# Patient Record
Sex: Male | Born: 1995 | Race: Black or African American | Hispanic: No | Marital: Single | Smoking: Never smoker
Health system: Southern US, Community
[De-identification: ages and names within clinical notes are randomized; demographics above are authoritative.]

## PROBLEM LIST (undated history)

## (undated) DIAGNOSIS — F488 Other specified nonpsychotic mental disorders: Secondary | ICD-10-CM

## (undated) DIAGNOSIS — R42 Dizziness and giddiness: Secondary | ICD-10-CM

## (undated) DIAGNOSIS — R4189 Other symptoms and signs involving cognitive functions and awareness: Secondary | ICD-10-CM

## (undated) DIAGNOSIS — K5900 Constipation, unspecified: Secondary | ICD-10-CM

## (undated) DIAGNOSIS — F419 Anxiety disorder, unspecified: Secondary | ICD-10-CM

## (undated) DIAGNOSIS — M542 Cervicalgia: Secondary | ICD-10-CM

## (undated) HISTORY — DX: Dizziness and giddiness: R42

## (undated) HISTORY — DX: Other symptoms and signs involving cognitive functions and awareness: R41.89

## (undated) HISTORY — DX: Anxiety disorder, unspecified: F41.9

## (undated) HISTORY — PX: WISDOM TOOTH EXTRACTION: SHX21

## (undated) HISTORY — DX: Cervicalgia: M54.2

---
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
## 2019-10-13 ENCOUNTER — Encounter (HOSPITAL_COMMUNITY): Payer: Self-pay | Admitting: Emergency Medicine

## 2019-10-13 ENCOUNTER — Emergency Department (HOSPITAL_COMMUNITY): Payer: Self-pay

## 2019-10-13 ENCOUNTER — Other Ambulatory Visit: Payer: Self-pay

## 2019-10-13 ENCOUNTER — Emergency Department (HOSPITAL_COMMUNITY)
Admission: EM | Admit: 2019-10-13 | Discharge: 2019-10-13 | Disposition: A | Discharge status: Home / Self Care | Payer: Self-pay | Attending: Emergency Medicine | Admitting: Emergency Medicine

## 2019-10-13 DIAGNOSIS — F419 Anxiety disorder, unspecified: Secondary | ICD-10-CM | POA: Insufficient documentation

## 2019-10-13 DIAGNOSIS — R42 Dizziness and giddiness: Secondary | ICD-10-CM | POA: Insufficient documentation

## 2019-10-13 DIAGNOSIS — H539 Unspecified visual disturbance: Secondary | ICD-10-CM | POA: Insufficient documentation

## 2019-10-13 LAB — CBC WITH DIFFERENTIAL/PLATELET
Abs Immature Granulocytes: 0.01 10*3/uL (ref 0.00–0.07)
Basophils Absolute: 0 10*3/uL (ref 0.0–0.1)
Basophils Relative: 0 %
Eosinophils Absolute: 0.1 10*3/uL (ref 0.0–0.5)
Eosinophils Relative: 1 %
HCT: 49.3 % (ref 39.0–52.0)
Hemoglobin: 15.7 g/dL (ref 13.0–17.0)
Immature Granulocytes: 0 %
Lymphocytes Relative: 22 %
Lymphs Abs: 1.2 10*3/uL (ref 0.7–4.0)
MCH: 28.6 pg (ref 26.0–34.0)
MCHC: 31.8 g/dL (ref 30.0–36.0)
MCV: 90 fL (ref 80.0–100.0)
Monocytes Absolute: 0.5 10*3/uL (ref 0.1–1.0)
Monocytes Relative: 10 %
Neutro Abs: 3.4 10*3/uL (ref 1.7–7.7)
Neutrophils Relative %: 67 %
Platelets: 241 10*3/uL (ref 150–400)
RBC: 5.48 MIL/uL (ref 4.22–5.81)
RDW: 11.9 % (ref 11.5–15.5)
WBC: 5.2 10*3/uL (ref 4.0–10.5)
nRBC: 0 % (ref 0.0–0.2)

## 2019-10-13 LAB — COMPREHENSIVE METABOLIC PANEL
ALT: 60 U/L — ABNORMAL HIGH (ref 0–44)
AST: 29 U/L (ref 15–41)
Albumin: 4.1 g/dL (ref 3.5–5.0)
Alkaline Phosphatase: 74 U/L (ref 38–126)
Anion gap: 14 (ref 5–15)
BUN: 10 mg/dL (ref 6–20)
CO2: 24 mmol/L (ref 22–32)
Calcium: 9.4 mg/dL (ref 8.9–10.3)
Chloride: 100 mmol/L (ref 98–111)
Creatinine, Ser: 1.08 mg/dL (ref 0.61–1.24)
GFR calc Af Amer: >60 mL/min (ref >60)
GFR calc non Af Amer: >60 mL/min (ref >60)
Glucose, Bld: 108 mg/dL — ABNORMAL HIGH (ref 70–99)
Potassium: 3.8 mmol/L (ref 3.5–5.1)
Sodium: 138 mmol/L (ref 135–145)
Total Bilirubin: 0.8 mg/dL (ref 0.3–1.2)
Total Protein: 7.7 g/dL (ref 6.5–8.1)

## 2019-10-13 MED ORDER — SODIUM CHLORIDE 0.9 % IV BOLUS
1000.0000 mL | Freq: Once | INTRAVENOUS | Status: AC
  Administered 2019-10-13: 1000 mL via INTRAVENOUS

## 2019-10-13 MED ORDER — GADOBUTROL 1 MMOL/ML IV SOLN
10.0000 mL | Freq: Once | INTRAVENOUS | Status: AC | PRN
  Administered 2019-10-13: 10 mL via INTRAVENOUS

## 2019-10-13 NOTE — ED Provider Notes (Signed)
[facility] EMERGENCY DEPARTMENT Provider Note   CSN: [id number] Arrival date & time: 10/13/19  0229     History Chief Complaint  Patient presents with  . Anxiety  . Dizziness    Jared Kelly is a 24 y.o. male.  HPI 24 year old male presents with lightheadedness.  Has multiple vague symptoms including lightheadedness constantly for at least 1 month.  Used to have lightheadedness that was attributed to his eyes but now has corrective lenses.  However he states for months his left eye has been blurry compared to the right despite seeing eye specialist.  No specific double vision but sometimes he will see a haze/after image.  On and off headaches.  No fever, vomiting, or numbness in the extremities.  The other day he was lifting weights and both arms felt weak though that has not continued.  Felt like he had an anxiety attack this morning which is why he presented today.  No current headache. Feels like he has been having memory problems/disorientation. Feels off balance when he walks.   Past Medical History:  Diagnosis Date  . Constipation   . Constipation   . Dizziness     Patient Active Problem List   Diagnosis Date Noted  . Generalized abdominal pain 05/26/2012  . Simple constipation 05/26/2012  . Dizziness     History reviewed. No pertinent surgical history.     Family History  Problem Relation Age of Onset  . Diabetes Other     Social History   Tobacco Use  . Smoking status: Never Smoker  . Smokeless tobacco: Never Used  Substance Use Topics  . Alcohol use: No  . Drug use: No    Home Medications Prior to Admission medications   Medication Sig Start Date End Date Taking? Authorizing Provider  tetrahydrozoline 0.05 % ophthalmic solution Place 1 drop into both eyes as needed (dry eyes).   Yes [provider]  ALPRAZolam (XANAX) 0.25 MG tablet Take 1 tablet (0.25 mg total) by mouth 3 (three) times daily as needed for  anxiety. Patient not taking: Reported on 10/13/2019 10/14/12   Charlynne Pander, MD  LORazepam (ATIVAN) 1 MG tablet Take 1 tablet (1 mg total) by mouth 2 (two) times daily as needed for anxiety. Patient not taking: Reported on 10/13/2019 07/03/15   Marlon Pel, PA-C  naphazoline-pheniramine (NAPHCON-A) 0.025-0.3 % ophthalmic solution Place 1 drop into both eyes every 4 (four) hours as needed. Patient not taking: Reported on 10/13/2019 10/14/12   Charlynne Pander, MD    Allergies    Penicillins  Review of Systems   Review of Systems  Eyes: Positive for visual disturbance.  Respiratory: Negative for shortness of breath.   Cardiovascular: Negative for chest pain.  Gastrointestinal: Negative for vomiting.  Neurological: Positive for light-headedness and headaches. Negative for dizziness and numbness.  All other systems reviewed and are negative.   Physical Exam Updated Vital Signs BP 122/71 (BP Location: Left Arm)   Pulse 99   Temp 98.2 F (36.8 C) (Oral)   Resp 16   Ht 6\' 3"  (1.905 m)   Wt 88 kg   SpO2 99%   BMI 24.25 kg/m   Physical Exam Vitals and nursing note reviewed.  Constitutional:      General: He is not in acute distress.    Appearance: He is well-developed. He is not ill-appearing or diaphoretic.  HENT:     Head: Normocephalic and atraumatic.     Right Ear: External ear normal.  Left Ear: External ear normal.     Nose: Nose normal.  Eyes:     General:        Right eye: No discharge.        Left eye: No discharge.     Extraocular Movements: Extraocular movements intact.     Pupils: Pupils are equal, round, and reactive to light.  Cardiovascular:     Rate and Rhythm: Normal rate and regular rhythm.     Heart sounds: Normal heart sounds.  Pulmonary:     Effort: Pulmonary effort is normal.     Breath sounds: Normal breath sounds.  Abdominal:     Palpations: Abdomen is soft.     Tenderness: There is no abdominal tenderness.  Musculoskeletal:     Cervical  back: Neck supple. No rigidity.  Skin:    General: Skin is warm and dry.  Neurological:     Mental Status: He is alert.     Comments: CN 3-12 grossly intact. 5/5 strength in all 4 extremities. Grossly normal sensation. Normal finger to nose.   Psychiatric:        Mood and Affect: Mood is not anxious.     ED Results / Procedures / Treatments   Labs (all labs ordered are listed, but only abnormal results are displayed) Labs Reviewed  COMPREHENSIVE METABOLIC PANEL - Abnormal; Notable for the following components:      Result Value   Glucose, Bld 108 (*)    ALT 60 (*)    All other components within normal limits  CBC WITH DIFFERENTIAL/PLATELET    EKG EKG Interpretation  Date/Time:  Thursday Oct 13 2019 08:35:46 EDT Ventricular Rate:  122 PR Interval:    QRS Duration: 91 QT Interval:  319 QTC Calculation: 455 R Axis:   89 Text Interpretation: Sinus tachycardia Consider right atrial enlargement LVH by voltage No old tracing to compare Confirmed by Sherwood Gambler [phone]) on 10/13/2019 8:52:35 AM   Radiology MR Brain W and Wo Contrast  Result Date: 10/13/2019 CLINICAL DATA:  Ataxia, stroke suspected; multiple sclerosis, new event. Additional provided: Patient reports dizziness and increased anxiety for the past month, reports sensitivity to light at times with headaches. EXAM: MRI HEAD WITHOUT AND WITH CONTRAST TECHNIQUE: Multiplanar, multiecho pulse sequences of the brain and surrounding structures were obtained without and with intravenous contrast. CONTRAST:  53mL GADAVIST GADOBUTROL 1 MMOL/ML IV SOLN COMPARISON:  Non-contrast head CT 04/24/2012 FINDINGS: Brain: No focal parenchymal signal abnormality or abnormal intracranial enhancement is demonstrated. There is no acute infarct. No evidence of intracranial mass. No chronic intracranial blood products. No extra-axial fluid collection. No midline shift. Vascular: Expected proximal arterial flow voids. Expected enhance within the  proximal large arterial vessels and dural venous sinuses. Skull and upper cervical spine: No focal marrow lesion. Sinuses/Orbits: Visualized orbits show no acute finding. Mild ethmoid sinus mucosal thickening. Small right maxillary sinus mucous retention cyst. No significant mastoid effusion. IMPRESSION: 1. Unremarkable MRI appearance of the brain. No evidence of acute intracranial abnormality. 2. Minimal ethmoid sinus mucosal thickening. 3. Small right maxillary sinus mucous retention cyst. Electronically Signed   By: Kellie Simmering DO   On: 10/13/2019 10:15    Procedures Procedures (including critical care time)  Medications Ordered in ED Medications  sodium chloride 0.9 % bolus 1,000 mL (1,000 mLs Intravenous New Bag/Given 10/13/19 0840)  gadobutrol (GADAVIST) 1 MMOL/ML injection 10 mL (10 mLs Intravenous Contrast Given 10/13/19 0951)    ED Course  I have reviewed the triage  vital signs and the nursing notes.  Pertinent labs & imaging results that were available during my care of the patient were reviewed by me and considered in my medical decision making (see chart for details).    MDM Rules/Calculators/A&P                      Patient's lab work has been reviewed and is unremarkable.  ECG also benign.  MRI images reviewed personally as well as report which shows no acute intracranial process.  This would help rule out MS, brain tumor, etc.  My suspicion of acute CNS infection is pretty low.  We will have him follow-up with his PCP. Final Clinical Impression(s) / ED Diagnoses Final diagnoses:  Lightheadedness    Rx / DC Orders ED Discharge Orders    None       Pricilla Loveless, MD 10/13/19 1101

## 2019-10-13 NOTE — ED Triage Notes (Signed)
Pt reports dizziness and increased anxiety for the past month. Reports sensitivity to light at times a/ headaches.  No neuro deficits in triage.

## 2019-10-13 NOTE — ED Notes (Signed)
Taken to CT.

## 2019-10-13 NOTE — ED Notes (Addendum)
Pt said that he will be outside

## 2019-11-03 ENCOUNTER — Ambulatory Visit: Payer: Self-pay | Admitting: Neurology

## 2019-11-03 ENCOUNTER — Encounter: Payer: Self-pay | Admitting: Neurology

## 2019-11-03 ENCOUNTER — Other Ambulatory Visit: Payer: Self-pay

## 2019-11-03 VITALS — BP 149/85 | HR 108 | Ht 75.0 in | Wt 191.0 lb

## 2019-11-03 DIAGNOSIS — R413 Other amnesia: Secondary | ICD-10-CM

## 2019-11-03 DIAGNOSIS — G43709 Chronic migraine without aura, not intractable, without status migrainosus: Secondary | ICD-10-CM

## 2019-11-03 DIAGNOSIS — IMO0002 Reserved for concepts with insufficient information to code with codable children: Secondary | ICD-10-CM | POA: Insufficient documentation

## 2019-11-03 MED ORDER — NORTRIPTYLINE HCL 10 MG PO CAPS: 20.0000 mg | ORAL_CAPSULE | Freq: Every day | ORAL | 11 refills | Status: DC

## 2019-11-03 NOTE — Progress Notes (Addendum)
PATIENT: Jared Kelly DOB: [birth year]  Chief Complaint  Patient presents with  . Neck Pain/Brain Fog    Reports intermittent neck and bilateral shoulder pain which is not his greatest concern. It does not radiate to his arms. He is more worried about being in a brain fog. He further describes this as decreased concentration, vision feels dimmed, fullness in ears, jaw tightness, light/noise sensitivity and sometimes excessive eye blinking. Feels using a computer makes it worse. Feels some of this has been going on since age 63. Says he was seen by neurology in the past but no clear etiology was determined.  . Orthopaedics    Jared Earls, MD - referring MD  . PCP    Jared Prader Raelyn Ensign, MD     HISTORICAL  Jared Kelly is a 24 year old male, seen in request by Dr. Delilah Shan, [place] for evaluation of constellation of complaints, initial evaluation was on Nov 03, 2019.   I have reviewed and summarized the referring note from the referring physician.  He moved to [place] at age 72, lives with his mother and sibling at home.  He graduated from [employer], currently work on Social worker, he did well from March 2020 until March 2021, then he began to experience constellation of the symptoms similar to what he experienced at age 33  He described frequent retro-orbital area pain, could not look at the computer anymore, which usually triggered his dizziness, neck pain, frequent eye blinking, he denies significant memory loss, but also lapses on some detail, such as what the day is, he describes ear fullness sensation, sensitive to light, for a while he stay up late on to 3 AM, had difficulty sleeping, he is trying to work on his circadian rhythm now, he was trying to move out to be independent in April 2021, but because of his recurrent body symptoms, concern of being ill, he is currently still stay at home, anxious about his health, he has headache about twice each month, starting at the  base of his neck, moving forward, settled to become retro-orbital area pressure headache, usually improve after overnight sleep  I reviewed multiple emergency room visit in 2013 when he was age 66, he presented to emergency room multiple times for complaints of dizziness, headaches, similar to his current complaints, had extensive multiple repeat laboratory evaluation at that time, which showed normal CBC, thyroid functional test, ESR, CMP,  He reported he tends to [employer] medical information, health related topic, this has caused increased anxiety on him I personally reviewed MRI of the brain with without contrast on Oct 13, 2019 that was normal Laboratory evaluation showed normal CBC CMP  REVIEW OF SYSTEMS: Full 14 system review of systems performed and notable only for above All other review of systems were negative.  ALLERGIES: Allergies  Allergen Reactions  . Penicillins Itching and Rash    HOME MEDICATIONS: Current Outpatient Medications  Medication Sig Dispense Refill  . Multiple Vitamin (MULTIVITAMIN) tablet Take 1 tablet by mouth daily.     No current facility-administered medications for this visit.    PAST MEDICAL HISTORY: Past Medical History:  Diagnosis Date  . Brain fog   . Neck pain     PAST SURGICAL HISTORY: Past Surgical History:  Procedure Laterality Date  . WISDOM TOOTH EXTRACTION      FAMILY HISTORY: Family History  Problem Relation Age of Onset  . Diabetes Other   . Healthy Mother   . Other Father  unsure of medical history    SOCIAL HISTORY: Social History   Socioeconomic History  . Marital status: Single    Spouse name: Not on file  . Number of children: 0  . Years of education: 40  . Highest education level: High school graduate  Occupational History  . 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 Smoking status: Never Smoker  . Smokeless tobacco: Never Used  Substance and Sexual  Activity  . Alcohol use: No  . Drug use: No  . Sexual activity: Not on file  Other Topics Concern  . Not on file  Social History Narrative   Lives at home with his mother and younger brother.   Right-handed.   No daily use of caffeine.   Social Determinants of Health   Financial Resource Strain:   . Difficulty of Paying Living Expenses:   Food Insecurity:   . Worried About Charity fundraiser in the Last Year:   . Arboriculturist in the Last Year:   Transportation Needs:   . Film/video editor (Medical):   Marland Kitchen Lack of Transportation (Non-Medical):   Physical Activity:   . Days of Exercise per Week:   . Minutes of Exercise per Session:   Stress:   . Feeling of Stress :   Social Connections:   . Frequency of Communication with Friends and Family:   . Frequency of Social Gatherings with Friends and Family:   . Attends Religious Services:   . Active Member of Clubs or Organizations:   . Attends Archivist Meetings:   Marland Kitchen Marital Status:   Intimate Partner Violence:   . Fear of Current or Ex-Partner:   . Emotionally Abused:   Marland Kitchen Physically Abused:   . Sexually Abused:      PHYSICAL EXAM   Vitals:   11/03/19 1055  BP: (!) 149/85  Pulse: (!) 108  Weight: 191 lb (86.6 kg)  Height: 6' 3"  (1.905 m)    Not recorded      Body mass index is 23.87 kg/m.  PHYSICAL EXAMNIATION:  Gen: NAD, conversant, well nourised, well groomed                     Cardiovascular: Regular rate rhythm, no peripheral edema, warm, nontender. Eyes: Conjunctivae clear without exudates or hemorrhage Neck: Supple, no carotid bruits. Pulmonary: Clear to auscultation bilaterally   NEUROLOGICAL EXAM:  MENTAL STATUS: Speech:    Speech is normal; fluent and spontaneous with normal comprehension.  Cognition:     Orientation to time, place and person     Normal recent and remote memory     Normal Attention span and concentration     Normal Language, naming, repeating,spontaneous  speech     Fund of knowledge   CRANIAL NERVES: CN II: Visual fields are full to confrontation. Pupils are round equal and briskly reactive to light. CN III, IV, VI: extraocular movement are normal. No ptosis. CN V: Facial sensation is intact to light touch CN VII: Face is symmetric with normal eye closure  CN VIII: Hearing is normal to causal conversation. CN IX, X: Phonation is normal. CN XI: Head turning and shoulder shrug are intact  MOTOR: There is no pronator drift of out-stretched arms. Muscle bulk and tone are normal. Muscle strength is normal.  REFLEXES: Reflexes are 2+ and symmetric at the biceps, triceps, knees, and ankles. Plantar responses are flexor.  SENSORY:  Intact to light touch, pinprick and vibratory sensation are intact in fingers and toes.  COORDINATION: There is no trunk or limb dysmetria noted.  GAIT/STANCE: Posture is normal. Gait is steady with normal steps, base, arm swing, and turning. Heel and toe walking are normal. Tandem gait is normal.  Romberg is absent.   DIAGNOSTIC DATA (LABS, IMAGING, TESTING) - I reviewed patient records, labs, notes, testing and imaging myself where available.   ASSESSMENT AND PLAN  Damarri Rampy is a 24 y.o. male   Chronic migraine  Normal neurological examination, MRI of the brain  More extensive laboratory evaluation including B12, TSH,  Likely underlying anxiety disorder, nortriptyline, titrating to 20 mg for preventive medications   Marcial Pacas, M.D. Ph.D.  [facility] [address] [place], [place] [address] Ph: [phone] Fax: [phone]  CC: Assunta Curtis, MD  Addendum reviewed ENT evaluation by Dr. 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

## 2019-11-04 LAB — VITAMIN B12: Vitamin B-12: 756 pg/mL (ref 232–1245)

## 2019-11-04 LAB — MAGNESIUM: Magnesium: 2.1 mg/dL (ref 1.6–2.3)

## 2019-11-04 LAB — RPR: RPR Ser Ql: NONREACTIVE

## 2019-11-04 LAB — TSH: TSH: 0.65 u[IU]/mL (ref 0.450–4.500)

## 2019-11-08 ENCOUNTER — Telehealth: Payer: Self-pay | Admitting: *Deleted

## 2019-11-08 NOTE — Telephone Encounter (Signed)
His mother answered the phone. She is aware of his normal lab results. States his EEG has been scheduled on 12/05/19.

## 2019-11-08 NOTE — Telephone Encounter (Signed)
-----   Message from Yijun Yan, MD sent at 11/08/2019  3:11 PM EDT ----- Please call patient for normal laboratory result 

## 2019-11-12 ENCOUNTER — Emergency Department (HOSPITAL_COMMUNITY)
Admission: EM | Admit: 2019-11-12 | Discharge: 2019-11-13 | Disposition: A | Discharge status: Home / Self Care | Payer: Self-pay | Attending: Emergency Medicine | Admitting: Emergency Medicine

## 2019-11-12 ENCOUNTER — Other Ambulatory Visit: Payer: Self-pay

## 2019-11-12 ENCOUNTER — Encounter (HOSPITAL_COMMUNITY): Payer: Self-pay

## 2019-11-12 DIAGNOSIS — R519 Headache, unspecified: Secondary | ICD-10-CM | POA: Insufficient documentation

## 2019-11-12 DIAGNOSIS — Z5321 Procedure and treatment not carried out due to patient leaving prior to being seen by health care provider: Secondary | ICD-10-CM | POA: Insufficient documentation

## 2019-11-12 DIAGNOSIS — R42 Dizziness and giddiness: Secondary | ICD-10-CM | POA: Insufficient documentation

## 2019-11-12 NOTE — ED Triage Notes (Signed)
Pt arrives POV for eval of lightheadedness and dizziness x 3 months. Pt has been seen for same about 1 mos ago w/ extensive workup at that time including MRI. States he also feels the need to pop his ears often.

## 2019-11-13 NOTE — ED Notes (Signed)
Pt called x3 for room no response 

## 2019-11-23 ENCOUNTER — Other Ambulatory Visit: Payer: Self-pay

## 2019-11-23 ENCOUNTER — Encounter (HOSPITAL_COMMUNITY): Payer: Self-pay | Admitting: *Deleted

## 2019-11-23 ENCOUNTER — Emergency Department (HOSPITAL_COMMUNITY)
Admission: EM | Admit: 2019-11-23 | Discharge: 2019-11-24 | Disposition: A | Discharge status: Home / Self Care | Payer: Self-pay | Attending: Emergency Medicine | Admitting: Emergency Medicine

## 2019-11-23 DIAGNOSIS — Z5321 Procedure and treatment not carried out due to patient leaving prior to being seen by health care provider: Secondary | ICD-10-CM | POA: Insufficient documentation

## 2019-11-23 DIAGNOSIS — R519 Headache, unspecified: Secondary | ICD-10-CM | POA: Insufficient documentation

## 2019-11-23 DIAGNOSIS — R42 Dizziness and giddiness: Secondary | ICD-10-CM | POA: Insufficient documentation

## 2019-11-23 NOTE — ED Triage Notes (Signed)
The pt is c/o headaches and dizziness since march  He has seedn all types of doctors but he has no diagnosis

## 2019-11-24 ENCOUNTER — Ambulatory Visit (HOSPITAL_COMMUNITY)
Admission: EM | Admit: 2019-11-24 | Discharge: 2019-11-24 | Disposition: A | Discharge status: Home / Self Care | Payer: Self-pay | Attending: Urgent Care | Admitting: Urgent Care

## 2019-11-24 ENCOUNTER — Encounter (HOSPITAL_COMMUNITY): Payer: Self-pay

## 2019-11-24 DIAGNOSIS — J341 Cyst and mucocele of nose and nasal sinus: Secondary | ICD-10-CM

## 2019-11-24 DIAGNOSIS — J0120 Acute ethmoidal sinusitis, unspecified: Secondary | ICD-10-CM

## 2019-11-24 DIAGNOSIS — R42 Dizziness and giddiness: Secondary | ICD-10-CM

## 2019-11-24 MED ORDER — CETIRIZINE HCL 10 MG PO TABS: 10.0000 mg | ORAL_TABLET | Freq: Every day | ORAL | 0 refills | Status: DC

## 2019-11-24 MED ORDER — MONTELUKAST SODIUM 10 MG PO TABS: 10.0000 mg | ORAL_TABLET | Freq: Every day | ORAL | 0 refills | Status: DC

## 2019-11-24 MED ORDER — FLUTICASONE PROPIONATE 50 MCG/ACT NA SUSP: 2.0000 | Freq: Every day | NASAL | 12 refills | Status: DC

## 2019-11-24 NOTE — ED Notes (Signed)
Pt called x3 with answer.

## 2019-11-24 NOTE — Discharge Instructions (Addendum)
Make sure you keep follow up with your ENT specialist in 2 weeks. In the meantime, start Flonase, Zyrtec and Singulair.

## 2019-11-24 NOTE — ED Provider Notes (Signed)
[place]   MRN: [id number] DOB: [birth year]  Subjective:   Jared Kelly is a 24 y.o. male presenting for 15-month history of persistent intermittent dizziness, jaw popping, right-sided facial fullness and occasional numbness.  Patient has had extensive work-up, has a consult with ENT in 2 weeks.  Has had an MRI, results below.  Does not take any chronic medications.  No current facility-administered medications for this encounter.  Current Outpatient Medications:  Marland Kitchen  Multiple Vitamin (MULTIVITAMIN) tablet, Take 1 tablet by mouth daily., Disp: , Rfl:  .  nortriptyline (PAMELOR) 10 MG capsule, Take 2 capsules (20 mg total) by mouth at bedtime., Disp: 60 capsule, Rfl: 11   Allergies  Allergen Reactions  . Penicillins Itching and Rash    Past Medical History:  Diagnosis Date  . Brain fog   . Neck pain      Past Surgical History:  Procedure Laterality Date  . WISDOM TOOTH EXTRACTION      Family History  Problem Relation Age of Onset  . Diabetes Other   . Healthy Mother   . Other Father        unsure of medical history    Social History   Tobacco Use  . Smoking status: Never Smoker  . Smokeless tobacco: Never Used  Substance Use Topics  . Alcohol use: No  . Drug use: No    ROS   Objective:   Vitals: BP (!) 114/93 (BP Location: Right Arm)   Pulse 91   Temp 98.6 F (37 C) (Oral)   Resp 18   SpO2 98%   Physical Exam Constitutional:      General: He is not in acute distress.    Appearance: Normal appearance. He is well-developed and normal weight. He is not ill-appearing, toxic-appearing or diaphoretic.  HENT:     Head: Normocephalic and atraumatic.     Right Ear: Ear canal and external ear normal. There is no impacted cerumen.     Left Ear: Ear canal and external ear normal. There is no impacted cerumen.     Ears:     Comments: TMs opacified bilaterally.    Nose: Nose normal. No congestion or rhinorrhea.     Mouth/Throat:     Mouth:  Mucous membranes are moist.     Pharynx: Oropharynx is clear. No oropharyngeal exudate or posterior oropharyngeal erythema.  Eyes:     General: No scleral icterus.       Right eye: No discharge.        Left eye: No discharge.     Extraocular Movements: Extraocular movements intact.     Conjunctiva/sclera: Conjunctivae normal.     Pupils: Pupils are equal, round, and reactive to light.  Cardiovascular:     Rate and Rhythm: Normal rate.  Pulmonary:     Effort: Pulmonary effort is normal.  Musculoskeletal:     Cervical back: Normal range of motion and neck supple. No rigidity. No muscular tenderness.  Skin:    General: Skin is warm and dry.  Neurological:     General: No focal deficit present.     Mental Status: He is alert and oriented to person, place, and time.  Psychiatric:        Mood and Affect: Mood normal.        Behavior: Behavior normal.        Thought Content: Thought content normal.        Judgment: Judgment normal.    MR Brain W and Wo Contrast  Result Date: 10/13/2019 CLINICAL DATA:  Ataxia, stroke suspected; multiple sclerosis, new event. Additional provided: Patient reports dizziness and increased anxiety for the past month, reports sensitivity to light at times with headaches. EXAM: MRI HEAD WITHOUT AND WITH CONTRAST TECHNIQUE: Multiplanar, multiecho pulse sequences of the brain and surrounding structures were obtained without and with intravenous contrast. CONTRAST:  71mL GADAVIST GADOBUTROL 1 MMOL/ML IV SOLN COMPARISON:  Non-contrast head CT 04/24/2012 FINDINGS: Brain: No focal parenchymal signal abnormality or abnormal intracranial enhancement is demonstrated. There is no acute infarct. No evidence of intracranial mass. No chronic intracranial blood products. No extra-axial fluid collection. No midline shift. Vascular: Expected proximal arterial flow voids. Expected enhance within the proximal large arterial vessels and dural venous sinuses. Skull and upper cervical  spine: No focal marrow lesion. Sinuses/Orbits: Visualized orbits show no acute finding. Mild ethmoid sinus mucosal thickening. Small right maxillary sinus mucous retention cyst. No significant mastoid effusion. IMPRESSION: 1. Unremarkable MRI appearance of the brain. No evidence of acute intracranial abnormality. 2. Minimal ethmoid sinus mucosal thickening. 3. Small right maxillary sinus mucous retention cyst. Electronically Signed   By: Jackey Loge DO   On: 10/13/2019 10:15     Assessment and Plan :   PDMP not reviewed this encounter.  1. Acute ethmoidal sinusitis, recurrence not specified   2. Dizziness   3. Mucous retention cyst of maxillary sinus     Given MRI results and upcoming consult with ENT, will start patient on Flonase, Singulair and Zyrtec daily.  Patient was agreeable to this.  Exam findings and vital signs stable for outpatient management, discharge. Counseled patient on potential for adverse effects with medications prescribed/recommended today, ER and return-to-clinic precautions discussed, patient verbalized understanding.    Wallis Bamberg, PA-C 11/24/19 1346

## 2019-11-24 NOTE — ED Triage Notes (Signed)
Pt presents with intermittent dizziness and jaw pain X 2 months.

## 2019-11-27 ENCOUNTER — Emergency Department (HOSPITAL_COMMUNITY)
Admission: EM | Admit: 2019-11-27 | Discharge: 2019-11-28 | Disposition: A | Discharge status: Home / Self Care | Payer: Self-pay | Attending: Emergency Medicine | Admitting: Emergency Medicine

## 2019-11-27 ENCOUNTER — Other Ambulatory Visit: Payer: Self-pay

## 2019-11-27 ENCOUNTER — Encounter (HOSPITAL_COMMUNITY): Payer: Self-pay | Admitting: Emergency Medicine

## 2019-11-27 DIAGNOSIS — J029 Acute pharyngitis, unspecified: Secondary | ICD-10-CM | POA: Insufficient documentation

## 2019-11-27 DIAGNOSIS — Z5321 Procedure and treatment not carried out due to patient leaving prior to being seen by health care provider: Secondary | ICD-10-CM | POA: Insufficient documentation

## 2019-11-27 NOTE — ED Notes (Signed)
Called pts name 3x to be taken back with no response.

## 2019-11-27 NOTE — ED Triage Notes (Signed)
Patient presents with multiple complaints: Sinus pressure with right ear fulness , " brain fog " and intermittent lightheaded for 2 months , denies head injury, alert and oriented , denies fever or chills .

## 2019-11-28 ENCOUNTER — Encounter (HOSPITAL_COMMUNITY): Payer: Self-pay

## 2019-11-28 ENCOUNTER — Ambulatory Visit (HOSPITAL_COMMUNITY)
Admission: EM | Admit: 2019-11-28 | Discharge: 2019-11-28 | Disposition: A | Discharge status: Home / Self Care | Payer: Self-pay | Attending: Family Medicine | Admitting: Family Medicine

## 2019-11-28 ENCOUNTER — Other Ambulatory Visit: Payer: Self-pay

## 2019-11-28 DIAGNOSIS — H6983 Other specified disorders of Eustachian tube, bilateral: Secondary | ICD-10-CM

## 2019-11-28 DIAGNOSIS — J019 Acute sinusitis, unspecified: Secondary | ICD-10-CM

## 2019-11-28 MED ORDER — PREDNISONE 10 MG (21) PO TBPK
ORAL_TABLET | Freq: Every day | ORAL | 0 refills | Status: DC
Start: 2019-11-28 — End: 2020-03-26

## 2019-11-28 MED ORDER — DOXYCYCLINE HYCLATE 100 MG PO CAPS: 100.0000 mg | ORAL_CAPSULE | Freq: Two times a day (BID) | ORAL | 0 refills | Status: DC

## 2019-11-28 NOTE — ED Provider Notes (Signed)
Jared Kelly   [id number] 11/28/19 Arrival Time: 1937  ASSESSMENT & PLAN:  1. Eustachian tube dysfunction, bilateral   2. Acute non-recurrent sinusitis, unspecified location     Reports that he has an appt with ENT within the next week or so. MRI 10/2019 results reviewed by me. Discussed and reassured.  Begin trial of: Meds ordered this encounter  Medications   predniSONE (STERAPRED UNI-PAK 21 TAB) 10 MG (21) TBPK tablet    Sig: Take by mouth daily. Take as directed.    Dispense:  21 tablet    Refill:  0   doxycycline (VIBRAMYCIN) 100 MG capsule    Sig: Take 1 capsule (100 mg total) by mouth 2 (two) times daily.    Dispense:  20 capsule    Refill:  0    OTC symptom care as needed. Ensure adequate fluid intake and rest.   Reviewed expectations re: course of current medical issues. Questions answered. Outlined signs and symptoms indicating need for more acute intervention. Patient verbalized understanding. After Visit Summary given.   SUBJECTIVE: History from: patient. Seen here 11/24/19; note reviewed by me; has been using medications given. Overall poor historian. Jared Kelly is a 24 y.o. male who presents with complaint of mild to moderate facial pressure. Reports possible "sinus problem" noted on MRI 10/2019. Occasional nasal congestion. Feels "lightheaded or foggy-headed" over the past couple of months. Bilateral (R>L) ear "pressure" without pain or drainage. Normal hearing. Respiratory symptoms: none. Fever: absent. Overall normal PO intake without n/v. OTC treatment: none reported. Seasonal allergies: "don't think so". History of frequent sinus infections: no. No specific aggravating or alleviating factors reported. Ambulatory without difficulty; no dysequilibrium or vertigo reported. No illicit drug use.  Social History   Tobacco Use  Smoking Status Never Smoker  Smokeless Tobacco Never Used     OBJECTIVE:  Vitals:   11/28/19 0826  BP:  116/66  Pulse: 99  Resp: 16  Temp: 98.6 F (37 C)  TempSrc: Oral  SpO2: 100%     General appearance: alert; no distress HEENT: mild nasal congestion/turbinate bogginess; no nasal FB; reports mild TTP over frontal sinus Neck: supple without LAD; trachea midline Lungs: unlabored respirations, cough: absent; no respiratory distress Skin: warm and dry Ext: moves all extremities normally Neuro: normal gait Psychological: alert and cooperative; normal mood and affect   Allergies  Allergen Reactions   Penicillins Itching and Rash    Past Medical History:  Diagnosis Date   Brain fog    Neck pain    Family History  Problem Relation Age of Onset   Diabetes Other    Healthy Mother    Other Father        unsure of medical history   Social History   Socioeconomic History   Marital status: Single    Spouse name: Not on file   Number of children: 0   Years of education: 12   Highest education level: High school graduate  Occupational History   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   Right-handed.   No daily use of caffeine.   Social Determinants  of Health   Financial Resource Strain:    Difficulty of Paying Living Expenses:   Food Insecurity:    Worried About Programme researcher, broadcasting/film/video in the Last Year:    Barista in the Last Year:   Transportation Needs:    Freight forwarder (Medical):    Lack of Transportation (Non-Medical):   Physical Activity:    Days of Exercise per Week:    Minutes of Exercise per Session:   Stress:    Feeling of Stress :   Social Connections:    Frequency of Communication with Friends and  Family:    Frequency of Social Gatherings with Friends and Family:    Attends Religious Services:    Active Member of Clubs or Organizations:    Attends Banker Meetings:    Marital Status:   Intimate Partner Violence:    Fear of Current or Ex-Partner:    Emotionally Abused:    Physically Abused:    Sexually Abused:             Jared Layman, MD 11/28/19 [phone]

## 2019-11-28 NOTE — ED Triage Notes (Signed)
Pt reports right ear fullness, light sensitivity and "brain fog" x 2 month. Pt states the brain fog is worse at night.

## 2019-12-02 ENCOUNTER — Other Ambulatory Visit: Payer: Self-pay

## 2019-12-02 ENCOUNTER — Ambulatory Visit (INDEPENDENT_AMBULATORY_CARE_PROVIDER_SITE_OTHER): Payer: Self-pay | Admitting: Family Medicine

## 2019-12-02 ENCOUNTER — Encounter: Payer: Self-pay | Admitting: Family Medicine

## 2019-12-02 VITALS — BP 114/74 | HR 86 | Temp 97.8°F | Resp 15 | Ht 75.0 in | Wt 185.4 lb

## 2019-12-02 DIAGNOSIS — R42 Dizziness and giddiness: Secondary | ICD-10-CM

## 2019-12-02 DIAGNOSIS — R4189 Other symptoms and signs involving cognitive functions and awareness: Secondary | ICD-10-CM

## 2019-12-02 DIAGNOSIS — J0120 Acute ethmoidal sinusitis, unspecified: Secondary | ICD-10-CM

## 2019-12-02 DIAGNOSIS — F488 Other specified nonpsychotic mental disorders: Secondary | ICD-10-CM

## 2019-12-02 DIAGNOSIS — M26609 Unspecified temporomandibular joint disorder, unspecified side: Secondary | ICD-10-CM

## 2019-12-02 NOTE — Patient Instructions (Addendum)
  Your exam is normal.  I do not have a good explanation for all of your symptoms.  Obviously your vision problems were part of the factor, but the contact lenses you were wearing should be taken care of that.  You do have mild sinusitis on the MRI, as well as a little cyst in one of the sinuses which really means nothing.  The ear nose and throat doctor will be evaluating you.  Make sure he feels like your eustachian tube on the right side is functioning normally.  Continue using the mouthguard as needed for your TMJ and grinding of teeth.  With the normal MRI and so far the neurologist not having found anything, I suspect the reports will come back good from her.  If needed return here and we can prescribe one of the SSRI type antianxiety medications for you if needed.  They are not habit-forming.  If you have satisfactory reports from both the ENT and neurologist, then I think it is reasonable to assume that much of your symptoms are from the anxiety, and you should see a counselor/psychologist.  Nicole Cella at [facility] is good, as well as the other man in his office.     IF you received an x-ray today, you will receive an invoice from [facility]. Please contact [facility] at [phone] with questions or concerns regarding your invoice.   IF you received labwork today, you will receive an invoice from [facility]. Please contact [facility] at [phone] with questions or concerns regarding your invoice.   Our billing staff will not be able to assist you with questions regarding bills from these companies.  You will be contacted with the lab results as soon as they are available. The fastest way to get your results is to activate your My Chart account. Instructions are located on the last page of this paperwork. If you have not heard from [place] regarding the results in 2 weeks, please contact this office.

## 2019-12-02 NOTE — Progress Notes (Signed)
Patient ID: Jared Kelly, male    DOB: Aug 20, 1995  Age: 24 y.o. MRN: [id number]  Chief Complaint  Patient presents with  . Ear Fullness    pt is seeking ENT referral, pt notes both ears feel full at times more frequently in the Rt side, pt also notes pain just behind his ears as well.     Subjective:   Almost 2 months ago the patient had some problems and went to the emergency room.  He had anxiety.  He was dizzy.  He had some ear sensation.  Not long afterwards he was having blurring of vision dizziness.  He saw the eye doctor and got contact lenses for astigmatism.  He felt some better after that.  Following that he had a headache issues for which she saw Dr. Terrace Arabia, neurologist.  He has an appointment back next week.  He is studied up things on the Internet.  He has an appointment with an ENT for next week.  He went to the emergency room and MRI showed some mild ethmoid sinusitis and a small sinus polyp.  His brain was normal.  He feels like he is in a fog sometimes.  He does not have any history of Covid.  Is a little bit of a difficult historian and I could not get everything in good sequence.  He did try using a mouthguard because he thought it might be TMJ causing pain in right ear ear discomfort.  That is helped some.  He says he is grinding his teeth before then.  He is not taking any medicines or using any substances or marijuana.  He is living with his parents right now, and if understood correctly he does not have a job right now.  He does have some history of anxiety.  Current allergies, medications, problem list, past/family and social histories reviewed.  Objective:  BP 114/74   Pulse 86   Temp 97.8 F (36.6 C) (Temporal)   Resp 15   Ht 6\' 3"  (1.905 m)   Wt 185 lb 6.4 oz (84.1 kg)   SpO2 97%   BMI 23.17 kg/m  Reviewed his charts and the MRI.  TMs are normal and ear canals clear.  Sinuses nontender.  Throat clear.  No TMJ or dental problems noted.  Neck supple without  significant nodes.  No carotid bruits.  Chest clear.  Heart rate without murmur.  Assessment & Plan:   Assessment: 1. Dizziness and giddiness   2. TMJ dysfunction   3. Subacute ethmoidal sinusitis   4. Brain fog       Plan: He had already concluded that if the ENT and neurology work-ups did not conclude anything he would seek counseling.  I recommended that he do so.  He is not wanting to be on medications, but if he keeps having problems and SSRI might be of value to him.  I urged him to get some regular exercise.  No orders of the defined types were placed in this encounter.   No orders of the defined types were placed in this encounter.        Patient Instructions    Your exam is normal.  I do not have a good explanation for all of your symptoms.  Obviously your vision problems were part of the factor, but the contact lenses you were wearing should be taken care of that.  You do have mild sinusitis on the MRI, as well as a little cyst in one of the sinuses  which really means nothing.  The ear nose and throat doctor will be evaluating you.  Make sure he feels like your eustachian tube on the right side is functioning normally.  Continue using the mouthguard as needed for your TMJ and grinding of teeth.  With the normal MRI and so far the neurologist not having found anything, I suspect the reports will come back good from her.  If needed return here and we can prescribe one of the SSRI type antianxiety medications for you if needed.  They are not habit-forming.  If you have satisfactory reports from both the ENT and neurologist, then I think it is reasonable to assume that much of your symptoms are from the anxiety, and you should see a counselor/psychologist.  George Hugh at [facility] is good, as well as the other man in his office.     IF you received an x-ray today, you will receive an invoice from [facility]. Please contact [facility] at [phone] with questions or concerns regarding your invoice.   IF you received labwork today, you will receive an invoice from [facility]. Please contact [facility] at [phone] with questions or concerns regarding your invoice.   Our billing staff will not be able to assist you with questions regarding bills from these companies.  You will be contacted with the lab results as soon as they are available. The fastest way to get your results is to activate your My Chart account. Instructions are located on the last page of this paperwork. If you have not heard from [place] regarding the results in 2 weeks, please contact this office.        Return if symptoms worsen or fail to improve.   Ruben Reason, MD 12/02/2019

## 2019-12-05 ENCOUNTER — Ambulatory Visit: Payer: Self-pay | Admitting: Neurology

## 2019-12-05 ENCOUNTER — Other Ambulatory Visit: Payer: Self-pay

## 2019-12-05 DIAGNOSIS — R413 Other amnesia: Secondary | ICD-10-CM

## 2019-12-05 DIAGNOSIS — IMO0002 Reserved for concepts with insufficient information to code with codable children: Secondary | ICD-10-CM

## 2019-12-05 DIAGNOSIS — R410 Disorientation, unspecified: Secondary | ICD-10-CM

## 2019-12-13 NOTE — Procedures (Signed)
   HISTORY: 24 years old male, concerning brain fog  TECHNIQUE:  This is a routine 16 channel EEG recording with one channel devoted to a limited EKG recording.  It was performed during wakefulness, drowsiness and asleep.  Hyperventilation and photic stimulation were performed as activating procedures.  There are minimum muscle and movement artifact noted.  Upon maximum arousal, posterior dominant waking rhythm consistent of rhythmic alpha range activity, with frequency of 10 hz. Activities are symmetric over the bilateral posterior derivations and attenuated with eye opening.  Hyperventilation produced mild/moderate buildup with higher amplitude and the slower activities noted.  Photic stimulation did not alter the tracing.  During EEG recording, patient developed drowsiness and no deeper stage of sleep was achieved  During EEG recording, there was no epileptiform discharge noted.  EKG demonstrate sinus rhythm, with heart rate of 100 beats per minutes  CONCLUSION: This is a  normal awake EEG.  There is no electrodiagnostic evidence of epileptiform discharge.  Levert Feinstein, M.D. Ph.D.  [facility] [address] [place], [place] [address] Phone: [phone] Fax:      [phone]

## 2020-01-18 ENCOUNTER — Ambulatory Visit: Payer: Self-pay | Admitting: Family Medicine

## 2020-01-19 ENCOUNTER — Encounter: Payer: Self-pay | Admitting: Family Medicine

## 2020-02-20 ENCOUNTER — Other Ambulatory Visit: Payer: Self-pay

## 2020-02-20 ENCOUNTER — Encounter (HOSPITAL_COMMUNITY): Payer: Self-pay

## 2020-02-20 ENCOUNTER — Ambulatory Visit (HOSPITAL_COMMUNITY)
Admission: EM | Admit: 2020-02-20 | Discharge: 2020-02-20 | Disposition: A | Discharge status: Home / Self Care | Payer: Self-pay | Attending: Family Medicine | Admitting: Family Medicine

## 2020-02-20 DIAGNOSIS — K529 Noninfective gastroenteritis and colitis, unspecified: Secondary | ICD-10-CM

## 2020-02-20 DIAGNOSIS — H60391 Other infective otitis externa, right ear: Secondary | ICD-10-CM

## 2020-02-20 MED ORDER — NEOMYCIN-POLYMYXIN-HC 3.5-10000-1 OT SUSP: 3.0000 [drp] | Freq: Two times a day (BID) | OTIC | 0 refills | Status: AC

## 2020-02-20 MED ORDER — FAMOTIDINE 20 MG PO TABS: 40.0000 mg | ORAL_TABLET | Freq: Every day | ORAL | 0 refills | Status: DC

## 2020-02-20 MED ORDER — MECLIZINE HCL 25 MG PO TABS: 25.0000 mg | ORAL_TABLET | Freq: Two times a day (BID) | ORAL | 0 refills | Status: DC | PRN

## 2020-02-20 NOTE — ED Provider Notes (Signed)
[facility]    CSN: [id number] Arrival date & time: 02/20/20  6301      History   Chief Complaint Chief Complaint  Patient presents with  . Abdominal Pain  . Vertigo    HPI Jared Kelly is a 24 y.o. male.   HPI  Patient with a history of recurrent dizziness and chronic recurrent abdominal pain, presents with a concern of generalized abdominal pain with associated nausea, diarrhea, and dizziness x 1 days ago.  Abdominal pain has resolved however he complains of pressure bilaterally in his ears, continues to have some mild dizziness and has seen an ENT in the past related to chronic ear inner ear issues.  He has remained afebrile throughout the course of his acute illness.  Denies any associated chest pain or shortness of breath.  Past Medical History:  Diagnosis Date  . Brain fog   . Neck pain     Patient Active Problem List   Diagnosis Date Noted  . Chronic migraine 11/03/2019  . Intermittent memory loss 11/03/2019  . Generalized abdominal pain 05/26/2012  . Simple constipation 05/26/2012  . Dizziness     Past Surgical History:  Procedure Laterality Date  . WISDOM TOOTH EXTRACTION         Home Medications    Prior to Admission medications   Medication Sig Start Date End Date Taking? Authorizing Provider  cetirizine (ZYRTEC ALLERGY) 10 MG tablet Take 1 tablet (10 mg total) by mouth daily. 11/24/19   Wallis Bamberg, PA-C  doxycycline (VIBRAMYCIN) 100 MG capsule Take 1 capsule (100 mg total) by mouth 2 (two) times daily. 11/28/19   Mardella Layman, MD  fluticasone (FLONASE) 50 MCG/ACT nasal spray Place 2 sprays into both nostrils daily. 11/24/19   Wallis Bamberg, PA-C  montelukast (SINGULAIR) 10 MG tablet Take 1 tablet (10 mg total) by mouth at bedtime. 11/24/19   Wallis Bamberg, PA-C  Multiple Vitamin (MULTIVITAMIN) tablet Take 1 tablet by mouth daily.    [provider]  nortriptyline (PAMELOR) 10 MG capsule Take 2 capsules (20 mg total) by mouth at  bedtime. 11/03/19   Levert Feinstein, MD  predniSONE (STERAPRED UNI-PAK 21 TAB) 10 MG (21) TBPK tablet Take by mouth daily. Take as directed. 11/28/19   Mardella Layman, MD    Family History Family History  Problem Relation Age of Onset  . Diabetes Other   . Healthy Mother   . Other Father        unsure of medical history    Social History Social History   Tobacco Use  . Smoking status: Never Smoker  . Smokeless tobacco: Never Used  Substance Use Topics  . Alcohol use: No  . Drug use: No     Allergies   Penicillins Review of Systems Review of Systems Pertinent negatives listed in HPI Physical Exam Triage Vital Signs ED Triage Vitals  Enc Vitals Group     BP 02/20/20 1215 108/63     Pulse Rate 02/20/20 1215 97     Resp 02/20/20 1215 18     Temp 02/20/20 1215 98.3 F (36.8 C)     Temp Source 02/20/20 1215 Oral     SpO2 02/20/20 1215 97 %     Weight --      Height --      Head Circumference --      Peak Flow --      Pain Score 02/20/20 1214 4     Pain Loc --  Pain Edu? --      Excl. in GC? --    No data found.  Updated Vital Signs BP 108/63 (BP Location: Left Arm)   Pulse 97   Temp 98.3 F (36.8 C) (Oral)   Resp 18   SpO2 97%   Visual Acuity Right Eye Distance:   Left Eye Distance:   Bilateral Distance:    Right Eye Near:   Left Eye Near:    Bilateral Near:     Physical Exam Constitutional:      Appearance: He is obese. He is not ill-appearing.  HENT:     Head: Normocephalic.     Left Ear: Tenderness present. Tympanic membrane is erythematous.     Mouth/Throat:     Mouth: Mucous membranes are moist.  Eyes:     Extraocular Movements: Extraocular movements intact.  Abdominal:     General: Bowel sounds are normal.     Palpations: Abdomen is soft.     Tenderness: There is no abdominal tenderness.  Neurological:     Mental Status: He is alert.  Psychiatric:        Attention and Perception: Attention normal.        Mood and Affect: Mood and  affect normal.       UC Treatments / Results  Labs (all labs ordered are listed, but only abnormal results are displayed) Labs Reviewed - No data to display  EKG   Radiology No results found.  Procedures Procedures (including critical care time)  Medications Ordered in UC Medications - No data to display  Initial Impression / Assessment and Plan / UC Course  I have reviewed the triage vital signs and the nursing notes.  Pertinent labs & imaging results that were available during my care of the patient were reviewed by me and considered in my medical decision making (see chart for details).  Left ear looks a little suspicious for early onset of otitis externa given tenderness and mild erythema and tragus tenderness.  Otic antibiotics prescribed.  Will prescribe as needed Pepcid as needed if abdominal symptoms recur.  Meclizine for dizziness however suspect this is related to recurrent inner ear issues.  Advised to follow-up with ENT if your symptoms persist in spite of treatment.  If severe abdominal pain occurs go immediately to the ER for further evaluation.  Patient verbalized understanding and agreement with plan. Final Clinical Impressions(s) / UC Diagnoses   Final diagnoses:  Gastroenteritis  Infective otitis externa of right ear   Discharge Instructions   None    ED Prescriptions    Medication Sig Dispense Auth. Provider   famotidine (PEPCID) 20 MG tablet Take 2 tablets (40 mg total) by mouth at bedtime. 30 tablet Bing Neighbors, FNP   meclizine (ANTIVERT) 25 MG tablet Take 1 tablet (25 mg total) by mouth 2 (two) times daily as needed for dizziness or nausea. 30 tablet Bing Neighbors, FNP   neomycin-polymyxin-hydrocortisone (CORTISPORIN) 3.5-10000-1 OTIC suspension Place 3 drops into the right ear in the morning and at bedtime for 7 days. 2.1 mL Bing Neighbors, FNP     PDMP not reviewed this encounter.   Bing Neighbors, FNP 02/26/20 2029

## 2020-02-20 NOTE — ED Triage Notes (Signed)
Pt presents with generalized abdominal discomfort, nausea, diarrhea and what he describes as motion dizziness.

## 2020-03-03 ENCOUNTER — Encounter (HOSPITAL_COMMUNITY): Payer: Self-pay

## 2020-03-03 ENCOUNTER — Ambulatory Visit (HOSPITAL_COMMUNITY)
Admission: EM | Admit: 2020-03-03 | Discharge: 2020-03-03 | Disposition: A | Discharge status: Home / Self Care | Payer: Self-pay | Attending: Family Medicine | Admitting: Family Medicine

## 2020-03-03 ENCOUNTER — Other Ambulatory Visit: Payer: Self-pay

## 2020-03-03 DIAGNOSIS — F419 Anxiety disorder, unspecified: Secondary | ICD-10-CM

## 2020-03-03 MED ORDER — HYDROXYZINE HCL 25 MG PO TABS: ORAL_TABLET | ORAL | 0 refills | Status: AC

## 2020-03-03 NOTE — ED Provider Notes (Signed)
[facility]    CSN: [id number] Arrival date & time: 03/03/20  1025      History   Chief Complaint Chief Complaint  Patient presents with  . Anxiety    HPI Jared Kelly is a 24 y.o. male.   Here today requesting some medication for anxiety due to an upcoming flight to [facility] he has planned. Has underlying hx of anxiety that becomes worse situationally, and notes flying is a trigger of his. Has never been on any medication for this aside from back in 2014 was given small amount of xanax in ED for an anxiety episode. States on the day to day it is quite manageable so has not felt the need to be on a daily anxiety medication. Denies SI/HI, mood concerns, frequent panic episodes, CP, palpitations, SOB.      Past Medical History:  Diagnosis Date  . Brain fog   . Neck pain     Patient Active Problem List   Diagnosis Date Noted  . Chronic migraine 11/03/2019  . Intermittent memory loss 11/03/2019  . Generalized abdominal pain 05/26/2012  . Simple constipation 05/26/2012  . Dizziness     Past Surgical History:  Procedure Laterality Date  . WISDOM TOOTH EXTRACTION         Home Medications    Prior to Admission medications   Medication Sig Start Date End Date Taking? Authorizing Provider  cetirizine (ZYRTEC ALLERGY) 10 MG tablet Take 1 tablet (10 mg total) by mouth daily. 11/24/19   Wallis Bamberg, PA-C  doxycycline (VIBRAMYCIN) 100 MG capsule Take 1 capsule (100 mg total) by mouth 2 (two) times daily. 11/28/19   Mardella Layman, MD  famotidine (PEPCID) 20 MG tablet Take 2 tablets (40 mg total) by mouth at bedtime. 02/20/20   Bing Neighbors, FNP  fluticasone (FLONASE) 50 MCG/ACT nasal spray Place 2 sprays into both nostrils daily. 11/24/19   Wallis Bamberg, PA-C  hydrOXYzine (ATARAX/VISTARIL) 25 MG tablet Take 0.5 tab to 2 tabs up to three times daily as needed for anxiety 03/03/20   Particia Nearing, PA-C  meclizine (ANTIVERT) 25 MG tablet Take 1 tablet (25  mg total) by mouth 2 (two) times daily as needed for dizziness or nausea. 02/20/20   Bing Neighbors, FNP  montelukast (SINGULAIR) 10 MG tablet Take 1 tablet (10 mg total) by mouth at bedtime. 11/24/19   Wallis Bamberg, PA-C  Multiple Vitamin (MULTIVITAMIN) tablet Take 1 tablet by mouth daily.    [provider]  nortriptyline (PAMELOR) 10 MG capsule Take 2 capsules (20 mg total) by mouth at bedtime. 11/03/19   Levert Feinstein, MD  predniSONE (STERAPRED UNI-PAK 21 TAB) 10 MG (21) TBPK tablet Take by mouth daily. Take as directed. 11/28/19   Mardella Layman, MD    Family History Family History  Problem Relation Age of Onset  . Diabetes Other   . Healthy Mother   . Other Father        unsure of medical history    Social History Social History   Tobacco Use  . Smoking status: Never Smoker  . Smokeless tobacco: Never Used  Substance Use Topics  . Alcohol use: No  . Drug use: No     Allergies   Penicillins   Review of Systems Review of Systems PER HPI   Physical Exam Triage Vital Signs ED Triage Vitals  Enc Vitals Group     BP 03/03/20 1150 130/83     Pulse Rate 03/03/20 1150 95  Resp 03/03/20 1150 16     Temp 03/03/20 1150 98.9 F (37.2 C)     Temp Source 03/03/20 1150 Oral     SpO2 03/03/20 1150 97 %     Weight --      Height --      Head Circumference --      Peak Flow --      Pain Score 03/03/20 1151 0     Pain Loc --      Pain Edu? --      Excl. in GC? --    No data found.  Updated Vital Signs BP 130/83 (BP Location: Right Arm)   Pulse 95   Temp 98.9 F (37.2 C) (Oral)   Resp 16   SpO2 97%   Visual Acuity Right Eye Distance:   Left Eye Distance:   Bilateral Distance:    Right Eye Near:   Left Eye Near:    Bilateral Near:     Physical Exam Vitals and nursing note reviewed.  Constitutional:      Appearance: Normal appearance.  HENT:     Head: Atraumatic.     Nose: Nose normal.     Mouth/Throat:     Mouth: Mucous membranes are moist.       Pharynx: Oropharynx is clear.  Eyes:     Extraocular Movements: Extraocular movements intact.     Conjunctiva/sclera: Conjunctivae normal.  Cardiovascular:     Rate and Rhythm: Normal rate and regular rhythm.  Pulmonary:     Effort: Pulmonary effort is normal.     Breath sounds: Normal breath sounds.  Abdominal:     General: Bowel sounds are normal. There is no distension.     Palpations: Abdomen is soft.     Tenderness: There is no abdominal tenderness. There is no guarding.  Musculoskeletal:        General: Normal range of motion.     Cervical back: Normal range of motion and neck supple.  Skin:    General: Skin is warm and dry.  Neurological:     General: No focal deficit present.     Mental Status: He is oriented to person, place, and time.  Psychiatric:        Mood and Affect: Mood normal.        Thought Content: Thought content normal.        Judgment: Judgment normal.      UC Treatments / Results  Labs (all labs ordered are listed, but only abnormal results are displayed) Labs Reviewed - No data to display  EKG   Radiology No results found.  Procedures Procedures (including critical care time)  Medications Ordered in UC Medications - No data to display  Initial Impression / Assessment and Plan / UC Course  I have reviewed the triage vital signs and the nursing notes.  Pertinent labs & imaging results that were available during my care of the patient were reviewed by me and considered in my medical decision making (see chart for details).     Situational anxiety, concerned about an upcoming flight to [place]. Discussed some options for prn anxiety treatment, he is agreeable to hydroxyzine. Discussed risks and side effects, dosing, when to use. F/u with PCP for further management particularly if anxiety becomes a more frequent issue.    Final Clinical Impressions(s) / UC Diagnoses   Final diagnoses:  Anxiety   Discharge Instructions   None     ED Prescriptions    Medication Sig  Dispense Auth. Provider   hydrOXYzine (ATARAX/VISTARIL) 25 MG tablet Take 0.5 tab to 2 tabs up to three times daily as needed for anxiety 30 tablet Particia Nearing, PA-C     PDMP not reviewed this encounter.   Particia Nearing, [place] 03/03/20 1217

## 2020-03-03 NOTE — ED Triage Notes (Signed)
Pt presents today wanting something for anxiety. States that he has a history of anxiety. States that he has never been given any medication for it before but he is about to take a plan trip to [place] and wants to have something for the flight because he is worried his anxiety is going to start due to being anxious about the plane flight.

## 2020-03-26 ENCOUNTER — Other Ambulatory Visit: Payer: Self-pay

## 2020-03-26 ENCOUNTER — Ambulatory Visit (INDEPENDENT_AMBULATORY_CARE_PROVIDER_SITE_OTHER): Payer: Self-pay | Admitting: Family Medicine

## 2020-03-26 ENCOUNTER — Other Ambulatory Visit (HOSPITAL_COMMUNITY)
Admission: RE | Admit: 2020-03-26 | Discharge: 2020-03-26 | Disposition: A | Discharge status: Home / Self Care | Payer: Self-pay | Source: Ambulatory Visit | Attending: Family Medicine | Admitting: Family Medicine

## 2020-03-26 VITALS — BP 117/76 | HR 76 | Temp 98.2°F | Ht 75.0 in | Wt 191.0 lb

## 2020-03-26 DIAGNOSIS — Z7251 High risk heterosexual behavior: Secondary | ICD-10-CM | POA: Insufficient documentation

## 2020-03-26 DIAGNOSIS — F411 Generalized anxiety disorder: Secondary | ICD-10-CM

## 2020-03-26 DIAGNOSIS — M26609 Unspecified temporomandibular joint disorder, unspecified side: Secondary | ICD-10-CM

## 2020-03-26 DIAGNOSIS — Z114 Encounter for screening for human immunodeficiency virus [HIV]: Secondary | ICD-10-CM

## 2020-03-26 DIAGNOSIS — Z1159 Encounter for screening for other viral diseases: Secondary | ICD-10-CM

## 2020-03-26 DIAGNOSIS — Z23 Encounter for immunization: Secondary | ICD-10-CM

## 2020-03-26 DIAGNOSIS — Z113 Encounter for screening for infections with a predominantly sexual mode of transmission: Secondary | ICD-10-CM

## 2020-03-26 DIAGNOSIS — R12 Heartburn: Secondary | ICD-10-CM

## 2020-03-26 DIAGNOSIS — R739 Hyperglycemia, unspecified: Secondary | ICD-10-CM

## 2020-03-26 NOTE — Patient Instructions (Addendum)
Hydroxyzine 1/2 -1 pill if needed for anxiety. If that occurs more often I do have a few medications we could consider. Continue counseling. Restarting exercise can help as well.   Continue to avoid foods that cause heartburn. pepcid over the counter if needed.  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  If not improving in next 3 weeks - return to discuss further.    Return to the clinic or go to the nearest emergency room if any of your symptoms worsen or new symptoms occur.   Temporomandibular Joint Syndrome  Temporomandibular joint syndrome (TMJ syndrome) is a condition that causes pain in the temporomandibular joints. These joints are located near your ears and allow your jaw to open and close. For people with TMJ syndrome, chewing, biting, or other movements of the jaw can be difficult or painful. TMJ syndrome is often mild and goes away within a few weeks. However, sometimes the condition becomes a long-term (chronic) problem. What are the causes? This condition may be caused by:  Grinding your teeth or clenching your jaw. Some people do this when they are under stress.  Arthritis.  Injury to the jaw.  Head or neck injury.  Teeth or dentures that are not aligned well. In some cases, the cause of TMJ syndrome may not be known. What are the signs or symptoms? The most common symptom of this condition is an aching pain on the side of the head in the area of the TMJ. Other symptoms may include:  Pain when moving your jaw, such as when chewing or biting.  Being unable to open your jaw all the way.  Making a clicking sound when you open your mouth.  Headache.  Earache.  Neck or shoulder pain. How is this diagnosed? This condition may be diagnosed based on:  Your symptoms and medical history.  A physical exam. Your health care provider may check the range of motion of your jaw.  Imaging tests, such as  X-rays or an MRI. You may also need to see your dentist, who will determine if your teeth and jaw are lined up correctly. How is this treated? TMJ syndrome often goes away on its own. If treatment is needed, the options may include:  Eating soft foods and applying ice or heat.  Medicines to relieve pain or inflammation.  Medicines or massage to relax the muscles.  A splint, bite plate, or mouthpiece to prevent teeth grinding or jaw clenching.  Relaxation techniques or counseling to help reduce stress.  A therapy for pain in which an electrical current is applied to the nerves through the skin (transcutaneous electrical nerve stimulation).  Acupuncture. This is sometimes helpful to relieve pain.  Jaw surgery. This is rarely needed. Follow these instructions at home:  Eating and drinking  Eat a soft diet if you are having trouble chewing.  Avoid foods that require a lot of chewing. Do not chew gum. General instructions  Take over-the-counter and prescription medicines only as told by your health care provider.  If directed, put ice on the painful area. ? Put ice in a plastic bag. ? Place a towel between your skin and the bag. ? Leave the ice on for 20 minutes, 2-3 times a day.  Apply a warm, wet cloth (warm compress) to the painful area as directed.  Massage your jaw area and do any jaw stretching exercises as told by your health  care provider.  If you were given a splint, bite plate, or mouthpiece, wear it as told by your health care provider.  Keep all follow-up visits as told by your health care provider. This is important. Contact a health care provider if:  You are having trouble eating.  You have new or worsening symptoms. Get help right away if:  Your jaw locks open or closed. Summary  Temporomandibular joint syndrome (TMJ syndrome) is a condition that causes pain in the temporomandibular joints. These joints are located near your ears and allow your jaw to  open and close.  TMJ syndrome is often mild and goes away within a few weeks. However, sometimes the condition becomes a long-term (chronic) problem.  Symptoms include an aching pain on the side of the head in the area of the TMJ, pain when chewing or biting, and being unable to open your jaw all the way. You may also make a clicking sound when you open your mouth.  TMJ syndrome often goes away on its own. If treatment is needed, it may include medicines to relieve pain, reduce inflammation, or relax the muscles. A splint, bite plate, or mouthpiece may also be used to prevent teeth grinding or jaw clenching. This information is not intended to replace advice given to you by your health care provider. Make sure you discuss any questions you have with your health care provider. Document Revised: 08/07/2017 Document Reviewed: 07/07/2017 Elsevier Patient Education  2020 Else[employer] Inc.  Managing Anxiety, Adult After being diagnosed with an anxiety disorder, you may be relieved to know why you have felt or behaved a certain way. You may also feel overwhelmed about the treatment ahead and what it will mean for your life. With care and support, you can manage this condition and recover from it. How to manage lifestyle changes Managing stress and anxiety  Stress is your body's reaction to life changes and events, both good and bad. Most stress will last just a few hours, but stress can be ongoing and can lead to more than just stress. Although stress can play a major role in anxiety, it is not the same as anxiety. Stress is usually caused by something external, such as a deadline, test, or competition. Stress normally passes after the triggering event has ended.  Anxiety is caused by something internal, such as imagining a terrible outcome or worrying that something will go wrong that will devastate you. Anxiety often does not go away even after the triggering event is over, and it can become long-term  (chronic) worry. It is important to understand the differences between stress and anxiety and to manage your stress effectively so that it does not lead to an anxious response. Talk with your health care provider or a counselor to learn more about reducing anxiety and stress. He or she may suggest tension reduction techniques, such as:  Music therapy. This can include creating or listening to music that you enjoy and that inspires you.  Mindfulness-based meditation. This involves being aware of your normal breaths while not trying to control your breathing. It can be done while sitting or walking.  Centering prayer. This involves focusing on a word, phrase, or sacred image that means something to you and brings you peace.  Deep breathing. To do this, expand your stomach and inhale slowly through your nose. Hold your breath for 3-5 seconds. Then exhale slowly, letting your stomach muscles relax.  Self-talk. This involves identifying thought patterns that lead to anxiety reactions and  changing those patterns.  Muscle relaxation. This involves tensing muscles and then relaxing them. Choose a tension reduction technique that suits your lifestyle and personality. These techniques take time and practice. Set aside 5-15 minutes a day to do them. Therapists can offer counseling and training in these techniques. The training to help with anxiety may be covered by some insurance plans. Other things you can do to manage stress and anxiety include:  Keeping a stress/anxiety diary. This can help you learn what triggers your reaction and then learn ways to manage your response.  Thinking about how you react to certain situations. You may not be able to control everything, but you can control your response.  Making time for activities that help you relax and not feeling guilty about spending your time in this way.  Visual imagery and yoga can help you stay calm and relax.  Medicines Medicines can help ease  symptoms. Medicines for anxiety include:  Anti-anxiety drugs.  Antidepressants. Medicines are often used as a primary treatment for anxiety disorder. Medicines will be prescribed by a health care provider. When used together, medicines, psychotherapy, and tension reduction techniques may be the most effective treatment. Relationships Relationships can play a big part in helping you recover. Try to spend more time connecting with trusted friends and family members. Consider going to couples counseling, taking family education classes, or going to family therapy. Therapy can help you and others better understand your condition. How to recognize changes in your anxiety Everyone responds differently to treatment for anxiety. Recovery from anxiety happens when symptoms decrease and stop interfering with your daily activities at home or work. This may mean that you will start to:  Have better concentration and focus. Worry will interfere less in your daily thinking.  Sleep better.  Be less irritable.  Have more energy.  Have improved memory. It is important to recognize when your condition is getting worse. Contact your health care provider if your symptoms interfere with home or work and you feel like your condition is not improving. Follow these instructions at home: Activity  Exercise. Most adults should do the following: ? Exercise for at least 150 minutes each week. The exercise should increase your heart rate and make you sweat (moderate-intensity exercise). ? Strengthening exercises at least twice a week.  Get the right amount and quality of sleep. Most adults need 7-9 hours of sleep each night. Lifestyle   Eat a healthy diet that includes plenty of vegetables, fruits, whole grains, low-fat dairy products, and lean protein. Do not eat a lot of foods that are high in solid fats, added sugars, or salt.  Make choices that simplify your life.  Do not use any products that contain  nicotine or tobacco, such as cigarettes, e-cigarettes, and chewing tobacco. If you need help quitting, ask your health care provider.  Avoid caffeine, alcohol, and certain over-the-counter cold medicines. These may make you feel worse. Ask your pharmacist which medicines to avoid. General instructions  Take over-the-counter and prescription medicines only as told by your health care provider.  Keep all follow-up visits as told by your health care provider. This is important. Where to find support You can get help and support from these sources:  Self-help groups.  Online and [employer].  A trusted spiritual leader.  Couples counseling.  Family education classes.  Family therapy. Where to find more information You may find that joining a support group helps you deal with your anxiety. The following sources can help  you locate counselors or support groups near you:  Mental Health America: www.mentalhealthamerica.net  Anxiety and Depression Association of [place] (ADAA): ProgramCam.de  [employer] Mental Illness (NAMI): www.nami.org Contact a health care provider if you:  Have a hard time staying focused or finishing daily tasks.  Spend many hours a day feeling worried about everyday life.  Become exhausted by worry.  Start to have headaches, feel tense, or have nausea.  Urinate more than normal.  Have diarrhea. Get help right away if you have:  A racing heart and shortness of breath.  Thoughts of hurting yourself or others. If you ever feel like you may hurt yourself or others, or have thoughts about taking your own life, get help right away. You can go to your nearest emergency department or call:  Your local emergency services (911 in the [place]  A suicide crisis helpline, such as the National Suicide Prevention Lifeline at 306-517-8154. This is open 24 hours a day. Summary  Taking steps to learn and use tension reduction techniques can  help calm you and help prevent triggering an anxiety reaction.  When used together, medicines, psychotherapy, and tension reduction techniques may be the most effective treatment.  Family, friends, and partners can play a big part in helping you recover from an anxiety disorder. This information is not intended to replace advice given to you by your health care provider. Make sure you discuss any questions you have with your health care provider. Document Revised: 10/26/2018 Document Reviewed: 10/26/2018 Elsevier Patient Education  2020 [employer].    Food Choices for Gastroesophageal Reflux Disease, Adult When you have gastroesophageal reflux disease (GERD), the foods you eat and your eating habits are very important. Choosing the right foods can help ease the discomfort of GERD. Consider working with a diet and nutrition specialist (dietitian) to help you make healthy food choices. What general guidelines should I follow?  Eating plan  Choose healthy foods low in fat, such as fruits, vegetables, whole grains, low-fat dairy products, and lean meat, fish, and poultry.  Eat frequent, small meals instead of three large meals each day. Eat your meals slowly, in a relaxed setting. Avoid bending over or lying down until 2-3 hours after eating.  Limit high-fat foods such as fatty meats or fried foods.  Limit your intake of oils, butter, and shortening to less than 8 teaspoons each day.  Avoid the following: ? Foods that cause symptoms. These may be different for different people. Keep a food diary to keep track of foods that cause symptoms. ? Alcohol. ? Drinking large amounts of liquid with meals. ? Eating meals during the 2-3 hours before bed.  Cook foods using methods other than frying. This may include baking, grilling, or broiling. Lifestyle  Maintain a healthy weight. Ask your health care provider what weight is healthy for you. If you need to lose weight, work with your health  care provider to do so safely.  Exercise for at least 30 minutes on 5 or more days each week, or as told by your health care provider.  Avoid wearing clothes that fit tightly around your waist and chest.  Do not use any products that contain nicotine or tobacco, such as cigarettes and e-cigarettes. If you need help quitting, ask your health care provider.  Sleep with the head of your bed raised. Use a wedge under the mattress or blocks under the bed frame to raise the head of the bed. What foods are not recommended? The  items listed may not be a complete list. Talk with your dietitian about what dietary choices are best for you. Grains Pastries or quick breads with added fat. [place] toast. Vegetables Deep fried vegetables. [place] fries. Any vegetables prepared with added fat. Any vegetables that cause symptoms. For some people this may include tomatoes and tomato products, chili peppers, onions and garlic, and horseradish. Fruits Any fruits prepared with added fat. Any fruits that cause symptoms. For some people this may include citrus fruits, such as oranges, grapefruit, pineapple, and lemons. Meats and other protein foods High-fat meats, such as fatty beef or pork, hot dogs, ribs, ham, sausage, salami and bacon. Fried meat or protein, including fried fish and fried chicken. Nuts and nut butters. Dairy Whole milk and chocolate milk. Sour cream. Cream. Ice cream. Cream cheese. Milk shakes. Beverages Coffee and tea, with or without caffeine. Carbonated beverages. Sodas. Energy drinks. Fruit juice made with acidic fruits (such as orange or grapefruit). Tomato juice. Alcoholic drinks. Fats and oils Butter. Margarine. Shortening. Ghee. Sweets and desserts Chocolate and cocoa. Donuts. Seasoning and other foods Pepper. Peppermint and spearmint. Any condiments, herbs, or seasonings that cause symptoms. For some people, this may include curry, hot sauce, or vinegar-based salad  dressings. Summary  When you have gastroesophageal reflux disease (GERD), food and lifestyle choices are very important to help ease the discomfort of GERD.  Eat frequent, small meals instead of three large meals each day. Eat your meals slowly, in a relaxed setting. Avoid bending over or lying down until 2-3 hours after eating.  Limit high-fat foods such as fatty meat or fried foods. This information is not intended to replace advice given to you by your health care provider. Make sure you discuss any questions you have with your health care provider. Document Revised: 09/16/2018 Document Reviewed: 05/27/2016 Elsevier Patient Education  [employer]2020 Else[employer] Inc.     If you have lab work done today you will be contacted with your lab results within the next 2 weeks.  If you have not heard from us then please contact us. The fastest way to get your results is to register for My Chart.   IF you received an x-ray today, you will receive an invoice from [facility]. Please contact [facility] at [phone] with questions or concerns regarding your invoice.   IF you received labwork today, you will receive an invoice from [facility]. Please contact [facility] at [phone] with questions or concerns regarding your invoice.   Our billing staff will not be able to assist you with questions regarding bills from these companies.  You will be contacted with the lab results as soon as they are available. The fastest way to get your results is to activate your My Chart account. Instructions are located on the last page of this paperwork. If you have not heard from us regarding the results in 2 weeks, please contact this office.

## 2020-03-26 NOTE — Progress Notes (Signed)
Subjective:  Patient ID: Jared Kelly, male    DOB: 26-Feb-1996  Age: 24 y.o. MRN: [id number]  CC:  Chief Complaint  Patient presents with   Establish Care    pt reports he feels fine with no complaints.    HPI Jared Kelly presents for   New patient establish care.    Migraine HA History of migraine, intermittent memory loss.  Treated with nortriptyline 20 mg nightly, followed by neurology, Dr. Terrace Arabia.  Normal EEG in June. Not taking nortriptyline. Did not take.   Anxiety: Urgent care visit in September.  Episodic  seeing counselor - helping anxiety.  Anxiety about health. No daily meds, and did not take hydroxyzine from Urgent Care.  Better with counseling.  Exercise prior, less when more anxious in April through July.  No alcohol.  No marijuana or other IDU.  Tried xanax in past. Did not like how he felt - after feeling.  No flowsheet data found.  Work - Therapist, sports day trading.   Heartburn -  No regular meds. Notes with certain foods or late meals. No regular meds.   Hx of unprotected intercourse Sometimes without condom with current partner past few months.  RPR nr in May.   Hyperglycemia: glucose 108 on 10/13/19.   At end of visit -c/o pooping feeling at times at jaw near ear.  Past year.  R ear.  Tense feeling, not painful. Sore in past with yawn - better recently  History Patient Active Problem List   Diagnosis Date Noted   Chronic migraine 11/03/2019   Intermittent memory loss 11/03/2019   Generalized abdominal pain 05/26/2012   Simple constipation 05/26/2012   Dizziness    Past Medical History:  Diagnosis Date   Anxiety    Phreesia 03/26/2020   Brain fog    Neck pain    Past Surgical History:  Procedure Laterality Date   WISDOM TOOTH EXTRACTION     Allergies  Allergen Reactions   Penicillins Itching and Rash   Prior to Admission medications   Medication Sig Start Date End Date Taking? Authorizing Provider  cetirizine (ZYRTEC  ALLERGY) 10 MG tablet Take 1 tablet (10 mg total) by mouth daily. 11/24/19  Yes Wallis Bamberg, PA-C  doxycycline (VIBRAMYCIN) 100 MG capsule Take 1 capsule (100 mg total) by mouth 2 (two) times daily. 11/28/19  Yes Mardella Layman, MD  famotidine (PEPCID) 20 MG tablet Take 2 tablets (40 mg total) by mouth at bedtime. 02/20/20  Yes Bing Neighbors, FNP  fluticasone (FLONASE) 50 MCG/ACT nasal spray Place 2 sprays into both nostrils daily. 11/24/19  Yes Wallis Bamberg, PA-C  hydrOXYzine (ATARAX/VISTARIL) 25 MG tablet Take 0.5 tab to 2 tabs up to three times daily as needed for anxiety 03/03/20  Yes Particia Nearing, PA-C  meclizine (ANTIVERT) 25 MG tablet Take 1 tablet (25 mg total) by mouth 2 (two) times daily as needed for dizziness or nausea. 02/20/20  Yes Bing Neighbors, FNP  montelukast (SINGULAIR) 10 MG tablet Take 1 tablet (10 mg total) by mouth at bedtime. 11/24/19  Yes Wallis Bamberg, PA-C  Multiple Vitamin (MULTIVITAMIN) tablet Take 1 tablet by mouth daily.   Yes [provider]  nortriptyline (PAMELOR) 10 MG capsule Take 2 capsules (20 mg total) by mouth at bedtime. 11/03/19  Yes Levert Feinstein, MD  predniSONE (STERAPRED UNI-PAK 21 TAB) 10 MG (21) TBPK tablet Take by mouth daily. Take as directed. 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   Right-handed.   No daily use of caffeine.    Social Determinants of Health   Financial Resource Strain:    Difficulty of Paying Living Expenses: Not on file  Food Insecurity:    Worried About Programme researcher, broadcasting/film/video in the Last Year: Not on file   [employer] of Food in the Last Year: Not on file  Transportation Needs:    Lack of Transportation (Medical): Not on file   Lack of Transportation (Non-Medical): Not on file  Physical Activity:    Days of Exercise per Week: Not on file   Minutes of Exercise per Session: Not on file  Stress:    Feeling of Stress : Not on file  Social Connections:    Frequency of Communication with Friends and Family: Not on file   Frequency of Social Gatherings with Friends and Family: Not on file   Attends Religious Services: Not on file   Active Member of Clubs or Organizations: Not on file   Attends Banker Meetings: Not on file   Marital Status: Not on file  Intimate Partner Violence:    Fear of Current or Ex-Partner: Not on file   Emotionally Abused: Not on file   Physically Abused: Not on file   Sexually Abused: Not on file    Review of Systems Per HPI  Objective:   Vitals:   03/26/20 1342  BP: 117/76  Pulse: 76  Temp: 98.2 F (36.8 C)  TempSrc: Temporal  SpO2: 100%  Weight: 191 lb (86.6 kg)  Height: 6\' 3"  (1.905 m)     Physical Exam Vitals reviewed.  Constitutional:      Appearance: He is well-developed.  HENT:     Head: Normocephalic and atraumatic.     Right Ear: Tympanic membrane, ear canal and external ear normal.     Left Ear: Tympanic membrane, ear canal and external ear normal.     Ears:     Comments: Min pop over tmj on right, nontender.  Eyes:     Pupils: Pupils are equal, round, and reactive to light.  Neck:     Vascular: No carotid bruit or JVD.  Cardiovascular:     Rate and Rhythm: Normal rate and regular rhythm.     Heart sounds: Normal heart sounds. No murmur heard.   Pulmonary:     Effort: Pulmonary effort is normal.      Breath sounds: Normal breath sounds. No rales.  Skin:    General: Skin is warm and dry.  Neurological:     Mental Status: He is alert and oriented to person, place, and time.  Psychiatric:        Attention and Perception: Attention and perception normal.        Mood and Affect: Affect is flat.  Speech: Speech normal.        Behavior: Behavior normal.        Thought Content: Thought content normal.     Assessment & Plan:  Jared Kelly is a 24 y.o. male . Anxiety state  -Improved with counseling.  Continue same.  Option of low-dose hydroxyzine.  RTC precautions if persistent symptoms, consider BuSpar or SSRI.  Need for vaccination - Plan: Tdap vaccine greater than or equal to 7yo IM  Screen for sexually transmitted diseases Unprotected sexual intercourse - Plan: Hepatitis C antibody, HIV antibody (with reflex), GC/Chlamydia probe amp ([place])not at [facility], RPR Need for hepatitis C screening test - Plan: Hepatitis C antibody Screening for HIV without presence of risk factors - Plan: HIV antibody (with reflex)  -STI screening, hep C screening with history of unprotected intercourse.  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  RTC precautions if persistent, short-term NSAID if needed.   No orders of the defined types were placed in this encounter.  Patient Instructions   Hydroxyzine 1/2 -1 pill if needed for anxiety. If that occurs more often I do have a few medications we could consider. Continue counseling. Restarting exercise can help as well.   Continue to avoid foods that cause heartburn. pepcid over the counter if needed.  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  If not improving in next 3 weeks - return to discuss further.    Return to the clinic or go to the nearest  emergency room if any of your symptoms worsen or new symptoms occur.   Temporomandibular Joint Syndrome  Temporomandibular joint syndrome (TMJ syndrome) is a condition that causes pain in the temporomandibular joints. These joints are located near your ears and allow your jaw to open and close. For people with TMJ syndrome, chewing, biting, or other movements of the jaw can be difficult or painful. TMJ syndrome is often mild and goes away within a few weeks. However, sometimes the condition becomes a long-term (chronic) problem. What are the causes? This condition may be caused by:  Grinding your teeth or clenching your jaw. Some people do this when they are under stress.  Arthritis.  Injury to the jaw.  Head or neck injury.  Teeth or dentures that are not aligned well. In some cases, the cause of TMJ syndrome may not be known. What are the signs or symptoms? The most common symptom of this condition is an aching pain on the side of the head in the area of the TMJ. Other symptoms may include:  Pain when moving your jaw, such as when chewing or biting.  Being unable to open your jaw all the way.  Making a clicking sound when you open your mouth.  Headache.  Earache.  Neck or shoulder pain. How is this diagnosed? This condition may be diagnosed based on:  Your symptoms and medical history.  A physical exam. Your health care provider may check the range of motion of your jaw.  Imaging tests, such as X-rays or an MRI. You may also need to see your dentist, who will determine if your teeth and jaw are lined up correctly. How is this treated? TMJ syndrome often goes away on its own. If treatment is needed, the options may include:  Eating soft foods and applying ice or heat.  Medicines to relieve pain or inflammation.  Medicines or massage to relax the muscles.  A splint, bite plate, or mouthpiece to prevent teeth grinding or jaw clenching.  Relaxation techniques or  counseling to help reduce stress.  A therapy for pain in which an electrical current is applied to the nerves through the skin (transcutaneous electrical nerve stimulation).  Acupuncture. This is sometimes helpful to relieve pain.  Jaw surgery. This is rarely needed. Follow these instructions at home:  Eating and drinking  Eat a soft diet if you are having trouble chewing.  Avoid foods that require a lot of chewing. Do not chew gum. General instructions  Take over-the-counter and prescription medicines only as told by your health care provider.  If directed, put ice on the painful area. ? Put ice in a plastic bag. ? Place a towel between your skin and the bag. ? Leave the ice on for 20 minutes, 2-3 times a day.  Apply a warm, wet cloth (warm compress) to the painful area as directed.  Massage your jaw area and do any jaw stretching exercises as told by your health care provider.  If you were given a splint, bite plate, or mouthpiece, wear it as told by your health care provider.  Keep all follow-up visits as told by your health care provider. This is important. Contact a health care provider if:  You are having trouble eating.  You have new or worsening symptoms. Get help right away if:  Your jaw locks open or closed. Summary  Temporomandibular joint syndrome (TMJ syndrome) is a condition that causes pain in the temporomandibular joints. These joints are located near your ears and allow your jaw to open and close.  TMJ syndrome is often mild and goes away within a few weeks. However, sometimes the condition becomes a long-term (chronic) problem.  Symptoms include an aching pain on the side of the head in the area of the TMJ, pain when chewing or biting, and being unable to open your jaw all the way. You may also make a clicking sound when you open your mouth.  TMJ syndrome often goes away on its own. If treatment is needed, it may include medicines to relieve pain,  reduce inflammation, or relax the muscles. A splint, bite plate, or mouthpiece may also be used to prevent teeth grinding or jaw clenching. This information is not intended to replace advice given to you by your health care provider. Make sure you discuss any questions you have with your health care provider. Document Revised: 08/07/2017 Document Reviewed: 07/07/2017 Elsevier Patient Education  2020 Elsevier Inc.  Managing Anxiety, Adult After being diagnosed with an anxiety disorder, you may be relieved to know why you have felt or behaved a certain way. You may also feel overwhelmed about the treatment ahead and what it will mean for your life. With care and support, you can manage this condition and recover from it. How to manage lifestyle changes Managing stress and anxiety  Stress is your body's reaction to life changes and events, both good and bad. Most stress will last just a few hours, but stress can be ongoing and can lead to more than just stress. Although stress can play a major role in anxiety, it is not the same as anxiety. Stress is usually caused by something external, such as a deadline, test, or competition. Stress normally passes after the triggering event has ended.  Anxiety is caused by something internal, such as imagining a terrible outcome or  worrying that something will go wrong that will devastate you. Anxiety often does not go away even after the triggering event is over, and it can become long-term (chronic) worry. It is important to understand the differences between stress and anxiety and to manage your stress effectively so that it does not lead to an anxious response. Talk with your health care provider or a counselor to learn more about reducing anxiety and stress. He or she may suggest tension reduction techniques, such as:  Music therapy. This can include creating or listening to music that you enjoy and that inspires you.  Mindfulness-based meditation. This  involves being aware of your normal breaths while not trying to control your breathing. It can be done while sitting or walking.  Centering prayer. This involves focusing on a word, phrase, or sacred image that means something to you and brings you peace.  Deep breathing. To do this, expand your stomach and inhale slowly through your nose. Hold your breath for 3-5 seconds. Then exhale slowly, letting your stomach muscles relax.  Self-talk. This involves identifying thought patterns that lead to anxiety reactions and changing those patterns.  Muscle relaxation. This involves tensing muscles and then relaxing them. Choose a tension reduction technique that suits your lifestyle and personality. These techniques take time and practice. Set aside 5-15 minutes a day to do them. Therapists can offer counseling and training in these techniques. The training to help with anxiety may be covered by some insurance plans. Other things you can do to manage stress and anxiety include:  Keeping a stress/anxiety diary. This can help you learn what triggers your reaction and then learn ways to manage your response.  Thinking about how you react to certain situations. You may not be able to control everything, but you can control your response.  Making time for activities that help you relax and not feeling guilty about spending your time in this way.  Visual imagery and yoga can help you stay calm and relax.  Medicines Medicines can help ease symptoms. Medicines for anxiety include:  Anti-anxiety drugs.  Antidepressants. Medicines are often used as a primary treatment for anxiety disorder. Medicines will be prescribed by a health care provider. When used together, medicines, psychotherapy, and tension reduction techniques may be the most effective treatment. Relationships Relationships can play a big part in helping you recover. Try to spend more time connecting with trusted friends and family members.  Consider going to couples counseling, taking family education classes, or going to family therapy. Therapy can help you and others better understand your condition. How to recognize changes in your anxiety Everyone responds differently to treatment for anxiety. Recovery from anxiety happens when symptoms decrease and stop interfering with your daily activities at home or work. This may mean that you will start to:  Have better concentration and focus. Worry will interfere less in your daily thinking.  Sleep better.  Be less irritable.  Have more energy.  Have improved memory. It is important to recognize when your condition is getting worse. Contact your health care provider if your symptoms interfere with home or work and you feel like your condition is not improving. Follow these instructions at home: Activity  Exercise. Most adults should do the following: ? Exercise for at least 150 minutes each week. The exercise should increase your heart rate and make you sweat (moderate-intensity exercise). ? Strengthening exercises at least twice a week.  Get the right amount and quality of sleep. Most adults need  7-9 hours of sleep each night. Lifestyle   Eat a healthy diet that includes plenty of vegetables, fruits, whole grains, low-fat dairy products, and lean protein. Do not eat a lot of foods that are high in solid fats, added sugars, or salt.  Make choices that simplify your life.  Do not use any products that contain nicotine or tobacco, such as cigarettes, e-cigarettes, and chewing tobacco. If you need help quitting, ask your health care provider.  Avoid caffeine, alcohol, and certain over-the-counter cold medicines. These may make you feel worse. Ask your pharmacist which medicines to avoid. General instructions  Take over-the-counter and prescription medicines only as told by your health care provider.  Keep all follow-up visits as told by your health care provider. This is  important. Where to find support You can get help and support from these sources:  Self-help groups.  Online and [employer].  A trusted spiritual leader.  Couples counseling.  Family education classes.  Family therapy. Where to find more information You may find that joining a support group helps you deal with your anxiety. The following sources can help you locate counselors or support groups near you:  Mental Health America: www.mentalhealthamerica.net  Anxiety and Depression Association of [place] (ADAA): ProgramCam.de  [employer] on Mental Illness (NAMI): www.nami.org Contact a health care provider if you:  Have a hard time staying focused or finishing daily tasks.  Spend many hours a day feeling worried about everyday life.  Become exhausted by worry.  Start to have headaches, feel tense, or have nausea.  Urinate more than normal.  Have diarrhea. Get help right away if you have:  A racing heart and shortness of breath.  Thoughts of hurting yourself or others. If you ever feel like you may hurt yourself or others, or have thoughts about taking your own life, get help right away. You can go to your nearest emergency department or call:  Your local emergency services (911 in the [place]  A suicide crisis helpline, such as the National Suicide Prevention Lifeline at 619-406-9198. This is open 24 hours a day. Summary  Taking steps to learn and use tension reduction techniques can help calm you and help prevent triggering an anxiety reaction.  When used together, medicines, psychotherapy, and tension reduction techniques may be the most effective treatment.  Family, friends, and partners can play a big part in helping you recover from an anxiety disorder. This information is not intended to replace advice given to you by your health care provider. Make sure you discuss any questions you have with your health care provider. Document Revised:  10/26/2018 Document Reviewed: 10/26/2018 Elsevier Patient Education  2020 [employer].    Food Choices for Gastroesophageal Reflux Disease, Adult When you have gastroesophageal reflux disease (GERD), the foods you eat and your eating habits are very important. Choosing the right foods can help ease the discomfort of GERD. Consider working with a diet and nutrition specialist (dietitian) to help you make healthy food choices. What general guidelines should I follow?  Eating plan  Choose healthy foods low in fat, such as fruits, vegetables, whole grains, low-fat dairy products, and lean meat, fish, and poultry.  Eat frequent, small meals instead of three large meals each day. Eat your meals slowly, in a relaxed setting. Avoid bending over or lying down until 2-3 hours after eating.  Limit high-fat foods such as fatty meats or fried foods.  Limit your intake of oils, butter, and shortening to less than 8  teaspoons each day.  Avoid the following: ? Foods that cause symptoms. These may be different for different people. Keep a food diary to keep track of foods that cause symptoms. ? Alcohol. ? Drinking large amounts of liquid with meals. ? Eating meals during the 2-3 hours before bed.  Cook foods using methods other than frying. This may include baking, grilling, or broiling. Lifestyle  Maintain a healthy weight. Ask your health care provider what weight is healthy for you. If you need to lose weight, work with your health care provider to do so safely.  Exercise for at least 30 minutes on 5 or more days each week, or as told by your health care provider.  Avoid wearing clothes that fit tightly around your waist and chest.  Do not use any products that contain nicotine or tobacco, such as cigarettes and e-cigarettes. If you need help quitting, ask your health care provider.  Sleep with the head of your bed raised. Use a wedge under the mattress or blocks under the bed frame to raise  the head of the bed. What foods are not recommended? The items listed may not be a complete list. Talk with your dietitian about what dietary choices are best for you. Grains Pastries or quick breads with added fat. [place] toast. Vegetables Deep fried vegetables. [place] fries. Any vegetables prepared with added fat. Any vegetables that cause symptoms. For some people this may include tomatoes and tomato products, chili peppers, onions and garlic, and horseradish. Fruits Any fruits prepared with added fat. Any fruits that cause symptoms. For some people this may include citrus fruits, such as oranges, grapefruit, pineapple, and lemons. Meats and other protein foods High-fat meats, such as fatty beef or pork, hot dogs, ribs, ham, sausage, salami and bacon. Fried meat or protein, including fried fish and fried chicken. Nuts and nut butters. Dairy Whole milk and chocolate milk. Sour cream. Cream. Ice cream. Cream cheese. Milk shakes. Beverages Coffee and tea, with or without caffeine. Carbonated beverages. Sodas. Energy drinks. Fruit juice made with acidic fruits (such as orange or grapefruit). Tomato juice. Alcoholic drinks. Fats and oils Butter. Margarine. Shortening. Ghee. Sweets and desserts Chocolate and cocoa. Donuts. Seasoning and other foods Pepper. Peppermint and spearmint. Any condiments, herbs, or seasonings that cause symptoms. For some people, this may include curry, hot sauce, or vinegar-based salad dressings. Summary  When you have gastroesophageal reflux disease (GERD), food and lifestyle choices are very important to help ease the discomfort of GERD.  Eat frequent, small meals instead of three large meals each day. Eat your meals slowly, in a relaxed setting. Avoid bending over or lying down until 2-3 hours after eating.  Limit high-fat foods such as fatty meat or fried foods. This information is not intended to replace advice given to you by your health care provider. Make  sure you discuss any questions you have with your health care provider. Document Revised: 09/16/2018 Document Reviewed: 05/27/2016 Elsevier Patient Education  [employer].     If you have lab work done today you will be contacted with your lab results within the next 2 weeks.  If you have not heard from [place] then please contact us. The fastest way to get your results is to register for My Chart.   IF you received an x-ray today, you will receive an invoice from [facility]. Please contact [facility] at [phone] with questions or concerns regarding your invoice.   IF you received labwork today, you will  receive an invoice from [facility]. Please contact [facility] at [phone] with questions or concerns regarding your invoice.   Our billing staff will not be able to assist you with questions regarding bills from these companies.  You will be contacted with the lab results as soon as they are available. The fastest way to get your results is to activate your My Chart account. Instructions are located on the last page of this paperwork. If you have not heard from [place] regarding the results in 2 weeks, please contact this office.         Signed, Merri Ray, MD [facility]

## 2020-03-27 LAB — GC/CHLAMYDIA PROBE AMP (~~LOC~~) NOT AT ARMC
Chlamydia: NEGATIVE
Comment: NEGATIVE
Comment: NORMAL
Neisseria Gonorrhea: NEGATIVE

## 2020-03-27 LAB — HEPATITIS C ANTIBODY: Hep C Virus Ab: <0.1 s/co ratio (ref 0.0–0.9)

## 2020-03-27 LAB — HEMOGLOBIN A1C
Est. average glucose Bld gHb Est-mCnc: 105 mg/dL
Hgb A1c MFr Bld: 5.3 % (ref 4.8–5.6)

## 2020-03-27 LAB — HIV ANTIBODY (ROUTINE TESTING W REFLEX): HIV Screen 4th Generation wRfx: NONREACTIVE

## 2020-03-27 LAB — RPR: RPR Ser Ql: NONREACTIVE

## 2020-09-24 ENCOUNTER — Ambulatory Visit: Payer: Self-pay | Admitting: Family Medicine

## 2020-10-20 IMAGING — MR MR HEAD WO/W CM
11 of 19 series · 22 of 48 positions shown · IV contrast (Yes MH)
Comparison: Non-contrast head CT 04/24/2012

CLINICAL DATA: Ataxia, stroke suspected; multiple sclerosis, new
event. Additional provided: Patient reports dizziness and increased
anxiety for the past month, reports sensitivity to light at times
with headaches.

EXAM:
MRI HEAD WITHOUT AND WITH CONTRAST
TECHNIQUE: Multiplanar, multiecho pulse sequences of the brain and surrounding
structures were obtained without and with intravenous contrast.
CONTRAST:  10mL GADAVIST GADOBUTROL 1 MMOL/ML IV SOLN

[Series 2: DWI · axial · 3.0mm · 0.94mm/px · z∈[-96,+57]mm · 2 of 106 slices shown (1 of 4)]
[im 1/106]
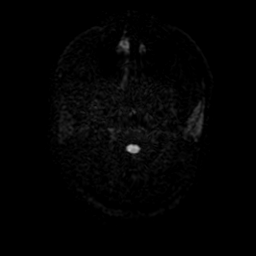
[im 106/106]
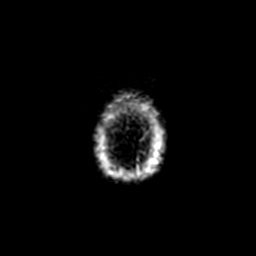

[Series 3: DWI · coronal · 4.0mm · 0.94mm/px · 2 of 74 slices shown (2 of 4)]
[im 1/74]
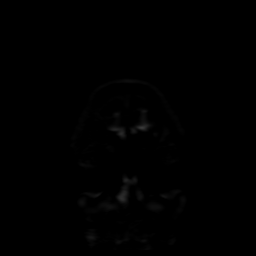
[im 74/74]
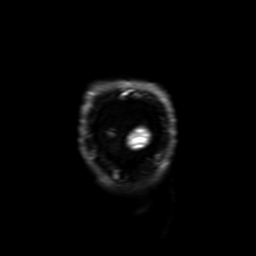

[Series 4: FLAIR · sagittal · 5.0mm · 0.23mm/px · 1 of 25 slices shown (1 of 3)]
[im 1/25]
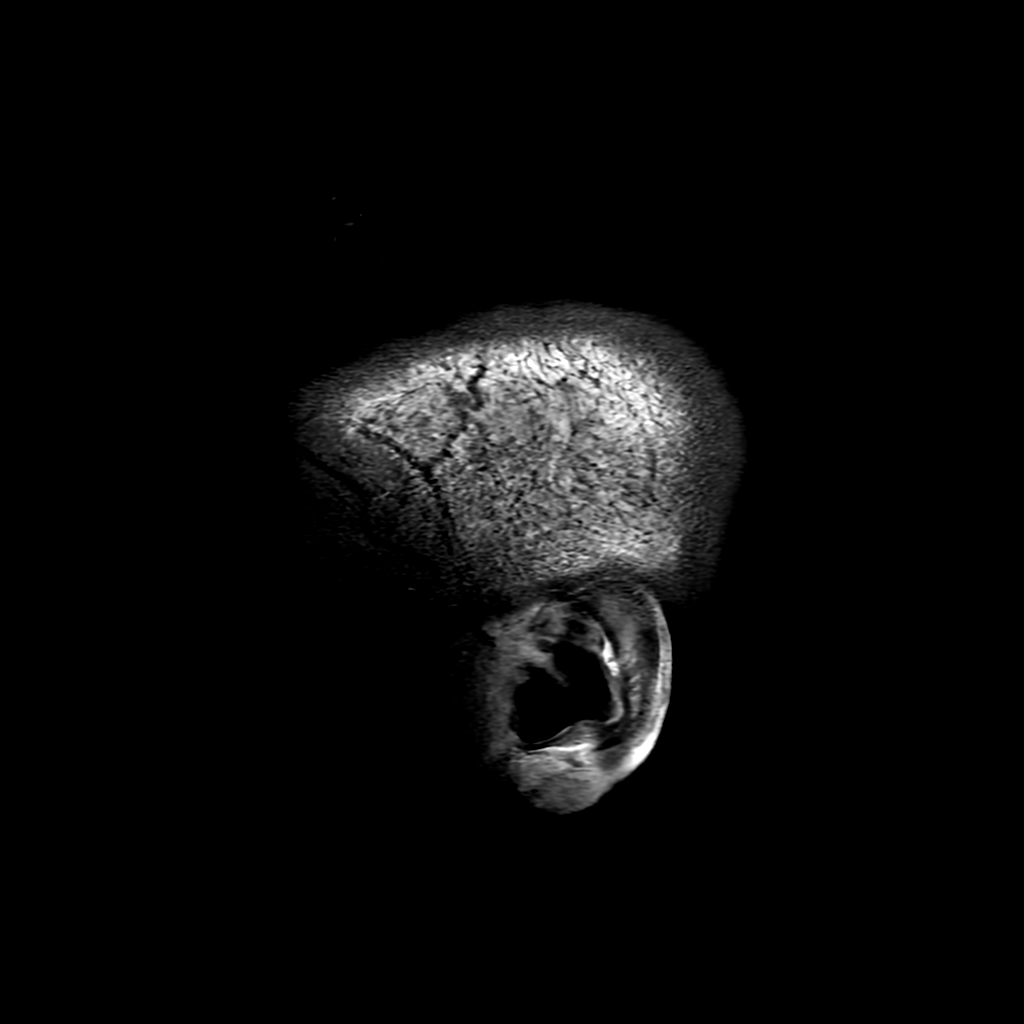

[Series 5: T2 · axial · 5.0mm · 0.23mm/px · 1 of 26 slices shown]
[im 1/26]
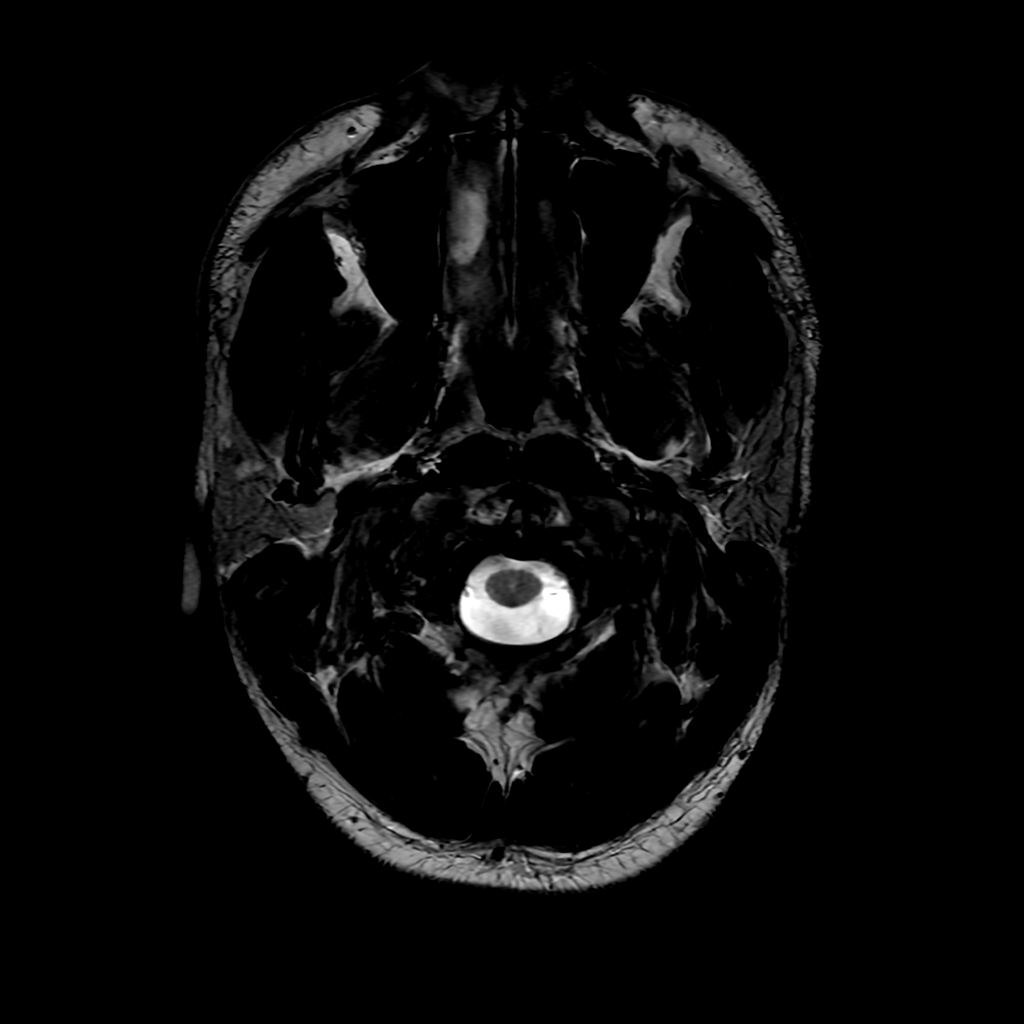

[Series 6: FLAIR · axial · 3.0mm · 0.45mm/px · 1 of 26 slices shown (2 of 3)]
[im 1/26]
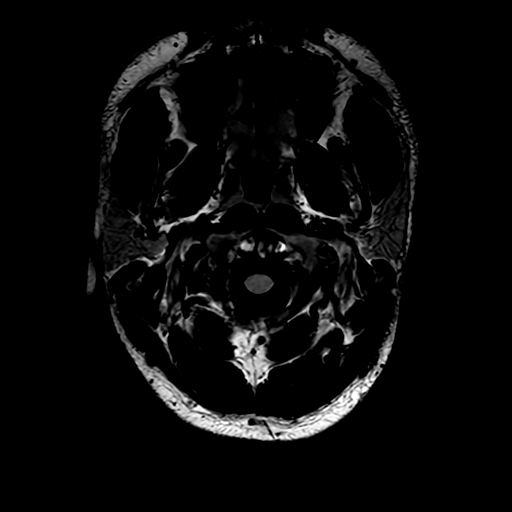

[Series 9: FLAIR · sagittal · 1.6mm · 0.49mm/px · 6 of 216 slices shown (3 of 3)]
[im 1/216]
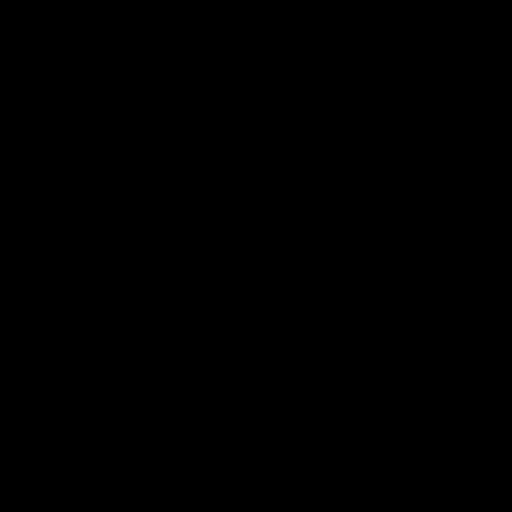
[im 44/216]
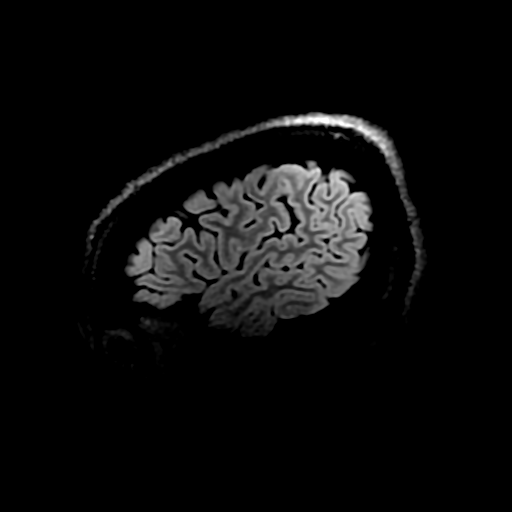
[im 87/216]
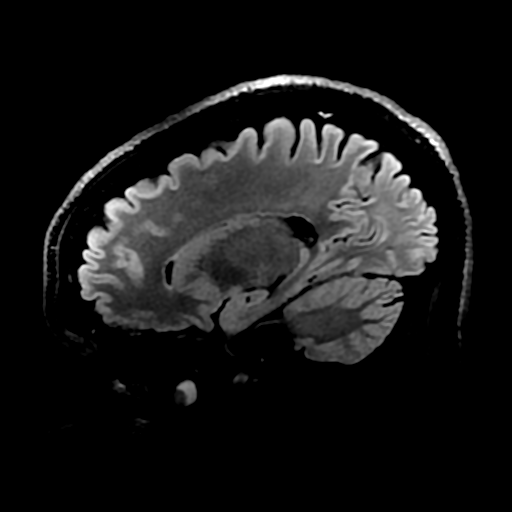
[im 130/216]
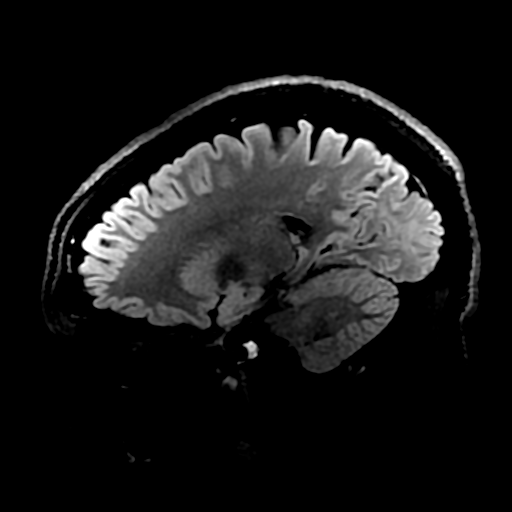
[im 173/216]
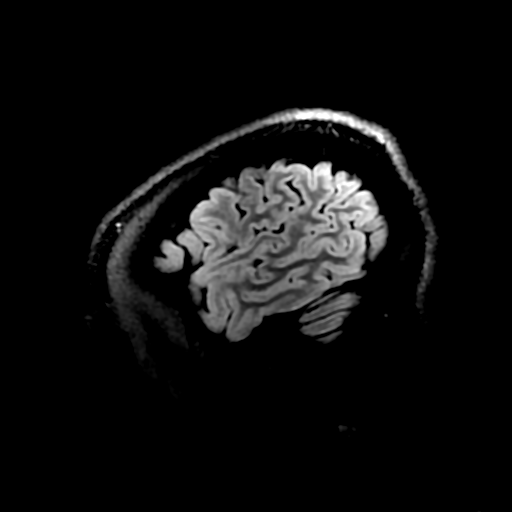
[im 216/216]
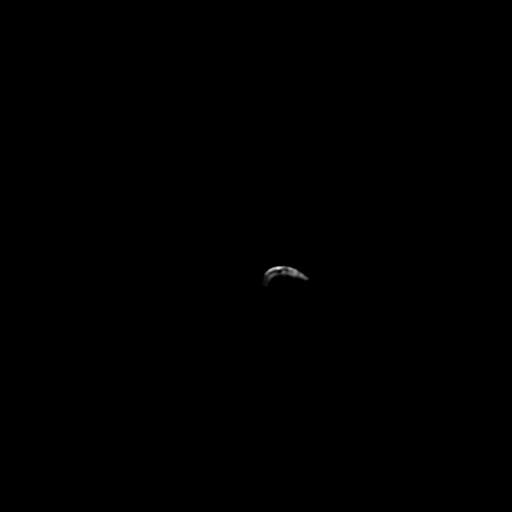

[Series 13: FLAIR post-contrast · sagittal · 5.0mm · 0.47mm/px · 1 of 25 slices shown]
[im 1/25]
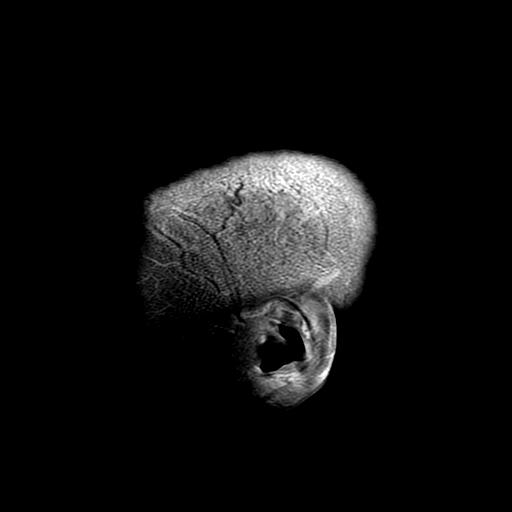

[Series 210: DWI · axial · 3.0mm · 0.94mm/px · z∈[-96,+57]mm · 3 of 106 slices shown (3 of 4)]
[im 1/106]
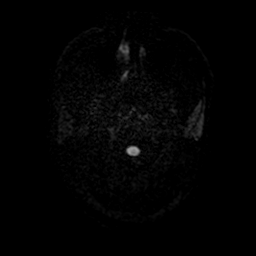
[im 53/106]
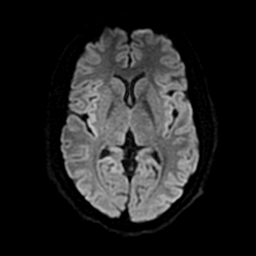
[im 106/106]
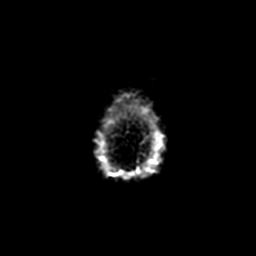

[Series 250: ADC · axial · 3.0mm · 0.94mm/px · z∈[-96,+57]mm · 2 of 52 slices shown (1 of 2)]
[im 1/52]
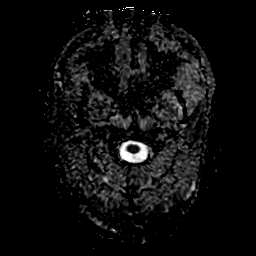
[im 52/52]
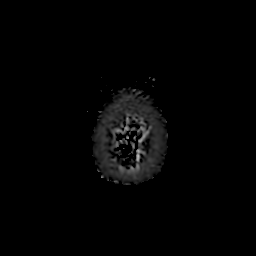

[Series 310: DWI · coronal · 4.0mm · 0.94mm/px · 2 of 74 slices shown (4 of 4)]
[im 1/74]
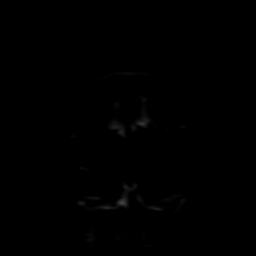
[im 74/74]
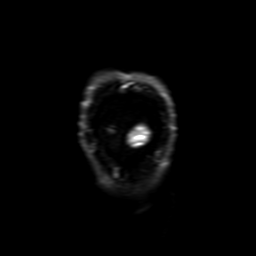

[Series 350: ADC · coronal · 4.0mm · 0.94mm/px · 1 of 37 slices shown (2 of 2)]
[im 1/37]
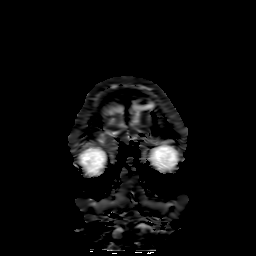

[22 of 48 positions shown; findings below may reference images not displayed]

FINDINGS: Brain:

No focal parenchymal signal abnormality or abnormal intracranial
enhancement is demonstrated.

There is no acute infarct.

No evidence of intracranial mass.

No chronic intracranial blood products.

No extra-axial fluid collection.

No midline shift.

Vascular: Expected proximal arterial flow voids. Expected enhance
within the proximal large arterial vessels and dural venous sinuses.

Skull and upper cervical spine: No focal marrow lesion.

Sinuses/Orbits: Visualized orbits show no acute finding. Mild
ethmoid sinus mucosal thickening. Small right maxillary sinus mucous
retention cyst. No significant mastoid effusion.
IMPRESSION: 1. Unremarkable MRI appearance of the brain. No evidence of acute
intracranial abnormality.
2. Minimal ethmoid sinus mucosal thickening.
3. Small right maxillary sinus mucous retention cyst.
